# Patient Record
Sex: Male | Born: 1975 | Hispanic: No | Marital: Married | Smoking: Never smoker
Health system: Southern US, Community
[De-identification: ages and names within clinical notes are randomized; demographics above are authoritative.]

## PROBLEM LIST (undated history)

## (undated) DIAGNOSIS — N200 Calculus of kidney: Secondary | ICD-10-CM

---

## 2002-03-30 ENCOUNTER — Emergency Department (HOSPITAL_COMMUNITY): Admission: EM | Admit: 2002-03-30 | Discharge: 2002-03-30 | Payer: Self-pay | Admitting: Emergency Medicine

## 2010-08-27 ENCOUNTER — Emergency Department (HOSPITAL_COMMUNITY)
Admission: EM | Admit: 2010-08-27 | Discharge: 2010-08-27 | Disposition: A | Discharge status: Home / Self Care | Payer: Self-pay | Attending: Emergency Medicine | Admitting: Emergency Medicine

## 2010-08-27 ENCOUNTER — Emergency Department (HOSPITAL_COMMUNITY): Payer: Self-pay

## 2010-08-27 ENCOUNTER — Encounter (HOSPITAL_COMMUNITY): Payer: Self-pay | Admitting: Radiology

## 2010-08-27 DIAGNOSIS — N201 Calculus of ureter: Secondary | ICD-10-CM | POA: Insufficient documentation

## 2010-08-27 DIAGNOSIS — R61 Generalized hyperhidrosis: Secondary | ICD-10-CM | POA: Insufficient documentation

## 2010-08-27 DIAGNOSIS — R109 Unspecified abdominal pain: Secondary | ICD-10-CM | POA: Insufficient documentation

## 2010-08-27 DIAGNOSIS — N390 Urinary tract infection, site not specified: Secondary | ICD-10-CM | POA: Insufficient documentation

## 2010-08-27 DIAGNOSIS — N1330 Unspecified hydronephrosis: Secondary | ICD-10-CM | POA: Insufficient documentation

## 2010-08-27 DIAGNOSIS — N200 Calculus of kidney: Secondary | ICD-10-CM | POA: Insufficient documentation

## 2010-08-27 LAB — URINE MICROSCOPIC-ADD ON

## 2010-08-27 LAB — URINALYSIS, ROUTINE W REFLEX MICROSCOPIC
Glucose, UA: NEGATIVE mg/dL
Ketones, ur: NEGATIVE mg/dL
Protein, ur: NEGATIVE mg/dL
pH: 5.5 (ref 5.0–8.0)

## 2010-08-28 LAB — URINE CULTURE

## 2011-09-10 ENCOUNTER — Encounter (HOSPITAL_COMMUNITY): Payer: Self-pay

## 2011-09-10 ENCOUNTER — Emergency Department (HOSPITAL_COMMUNITY)
Admission: EM | Admit: 2011-09-10 | Discharge: 2011-09-10 | Disposition: A | Discharge status: Home / Self Care | Payer: Self-pay | Attending: Emergency Medicine | Admitting: Emergency Medicine

## 2011-09-10 ENCOUNTER — Emergency Department (HOSPITAL_COMMUNITY): Payer: Self-pay

## 2011-09-10 DIAGNOSIS — R109 Unspecified abdominal pain: Secondary | ICD-10-CM | POA: Insufficient documentation

## 2011-09-10 DIAGNOSIS — Z87442 Personal history of urinary calculi: Secondary | ICD-10-CM | POA: Insufficient documentation

## 2011-09-10 DIAGNOSIS — N200 Calculus of kidney: Secondary | ICD-10-CM | POA: Insufficient documentation

## 2011-09-10 HISTORY — DX: Calculus of kidney: N20.0

## 2011-09-10 LAB — POCT I-STAT, CHEM 8
Calcium, Ion: 1.26 mmol/L (ref 1.12–1.32)
Glucose, Bld: 105 mg/dL — ABNORMAL HIGH (ref 70–99)
HCT: 50 % (ref 39.0–52.0)
Hemoglobin: 17 g/dL (ref 13.0–17.0)
TCO2: 26 mmol/L (ref 0–100)

## 2011-09-10 LAB — URINE MICROSCOPIC-ADD ON

## 2011-09-10 LAB — URINALYSIS, ROUTINE W REFLEX MICROSCOPIC
Glucose, UA: NEGATIVE mg/dL
pH: 5.5 (ref 5.0–8.0)

## 2011-09-10 MED ORDER — IBUPROFEN 800 MG PO TABS: 800.0000 mg | ORAL_TABLET | Freq: Three times a day (TID) | ORAL | Status: AC

## 2011-09-10 MED ORDER — OXYCODONE-ACETAMINOPHEN 5-325 MG PO TABS: ORAL_TABLET | ORAL | Status: AC

## 2011-09-10 MED ORDER — KETOROLAC TROMETHAMINE 30 MG/ML IJ SOLN
30.0000 mg | Freq: Once | INTRAMUSCULAR | Status: AC
  Administered 2011-09-10: 30 mg via INTRAVENOUS
  Filled 2011-09-10: qty 1

## 2011-09-10 NOTE — ED Notes (Signed)
Patient returned from xray.

## 2011-09-10 NOTE — ED Notes (Signed)
Rt. Flank pain began last night, pain when he is voiding. Denies any n/v denies any hematuria

## 2011-09-10 NOTE — Discharge Instructions (Signed)
Take ibuprofen for pain and use percocet for breakthrough pain but do not drive or operate machinery with percocet use. Strain all your urine and keep any stone caught that you should take with you to urology follow up. Follow up with urologist in the next 1-2 weeks for recheck of ongoing pain but return to [facility] ER for changing or worsening of symptoms.   Piedras o clculos renales (Kidney Stones)  Los clculos renales (litiasis ureteral) son depsitos que se forman en el interior de los riones. El dolor intenso es causado por el movimiento de la piedra a travs del tracto urinario. Cuando la piedra se mueve, el ureter hace un espasmo alrededor de la misma. El clculo generalmente se elimina con la [place]. CAUSAS  Un trastorno que hace que ciertas glndulas del cuello produzcan demasiada hormona paratiroidea (hiperparatiroidismo primario).   La acumulacin de cristales de cido rico.   Estrechamiento del urter.   Obstruccin en el rin presente al nacer (obstruccin congnita).   Cirugas previas del rin o los urteres.   Varias infecciones renales.  SNTOMAS  Ganas de vomitar (nuseas ).   Vmitos   Sangre en la orina (hematuria).   Dolor que generalmente se expande (irradia) hacia la ingle.   Ganas de orinar con frecuencia o de manera urgente.  DIAGNSTICO  Historia clnica y examen fsico.   Anlisis de sangre y [place].   Tomografas computadas.   En algunos casos se realiza un examen del interior de la vejiga (citoscopa).  TRATAMIENTO  Observacin.   Aumente su ingesta de lquidos.   Ser necesaria la ciruga si tiene dolor muy intenso o la obstruccin persiste.  El tamao, la ubicacin y la composicin qumica son variables importantes que determinarn la eleccin correcta de tratamiento para su caso. Comunquese con su mdico para comprender mejor su afeccin, de modo que pueda minimizar los riesgos de lesiones en el rin.  INSTRUCCIONES PARA EL  CUIDADO DOMICILIARIO  Beba gran cantidad de lquidos para mantener la orina de tono claro o color amarillo plido.   Filtre toda la orina (le proporcionarn un filtro). Guarde todas las partculas y piedras para que las vea el profesional que lo asiste. La piedra que causa el dolor puede ser tan diminuta como un grano de sal. Es muy importante que utilice el filtro todas las veces que orine. Obtener la piedra permitir al profesional Financial risk analyst y Investment banker, operational que de hecho ha sido expulsada.   Utilice los medicamentos de venta libre o de prescripcin para Chief Technology Officer, Environmental health practitioner o la [place], segn se lo indique el profesional que lo asiste.   Cumpla con las citas de seguimiento tal como le indic el profesional que lo asiste.   Puede necesitar un seguimiento especializado con radiografas. La ausencia de dolor no siempre significa que el clculo se ha eliminado. Puede ser simplemente que haya dejado de moverse. Si el paso de orina permanece completamente obstruido, puede producirse la prdida de la funcin renal o la destruccin del rin. Es su responsabilidad cumplir el seguimiento y las radiografas indicadas. Pueden realizarse ecografas del rin para verificar obsstrucciones y Training and development officer del mismo. Las ecografas no producen radiacin y pueden realizarse fcilmente en cuestin de minutos.  SOLICITE ATENCIN MDICA DE INMEDIATO SI:  No puede controlar el dolor con los medicamentos que le han prescrito.   Tiene fiebre.   La gravedad o intensidad del dolor aumenta luego de 18 horas y no se reduce con los medicamentos para Chief Technology Officer.   Presenta  un nuevo episodio de dolor abdominal.   Se marea o pierde el conocimiento.  EST SEGURO QUE:   Comprende las instrucciones para el alta mdica.   Controlar su enfermedad.   Solicitar atencin mdica de inmediato segn las indicaciones.  Document Released: 04/18/2005 Document Revised: 04/07/2011 [facility] Patient Information 2012 [place], [place].

## 2011-09-10 NOTE — ED Provider Notes (Signed)
History     CSN: [id number]  Arrival date & time 09/10/11  4098   First MD Initiated Contact with Patient 09/10/11 1007      Chief Complaint  Patient presents with  . Flank Pain    (Consider location/radiation/quality/duration/timing/severity/associated sxs/prior treatment) Patient is a 36 y.o. male presenting with flank pain.  Flank Pain   Patient who states he had kidney stone approximately a year ago presents to ER complaining of acute onset right flank pain that began last night and has been intermittent since onset but consistent with hx of kidney stone pain. Patient has taken nothing for pain PTA. Denies fevers, chills, CP, SOB, abdominal pain, n/v/d, hematuria, or dysuria. Patient states he has pain in his right flank with urination. Denies aggravating or alleviating factors. Patient states he has no other known medical problems and takes no meds on regular basis.  Past Medical History  Diagnosis Date  . Kidney calculi     History reviewed. No pertinent past surgical history.  No family history on file.  History  Substance Use Topics  . Smoking status: Never Smoker   . Smokeless tobacco: Not on file  . Alcohol Use: No      Review of Systems  Genitourinary: Positive for flank pain.  All other systems reviewed and are negative.    Allergies  Review of patient's allergies indicates no known allergies.  Home Medications   Current Outpatient Rx  Name Route Sig Dispense Refill  . ACETAMINOPHEN 500 MG PO TABS Oral Take 1,000 mg by mouth every 6 (six) hours as needed. For pain    . IBUPROFEN 800 MG PO TABS Oral Take 1 tablet (800 mg total) by mouth 3 (three) times daily. Take 800mg  by mouth at breakfast, lunch and dinner for the next 5 days 21 tablet 0  . OXYCODONE-ACETAMINOPHEN 5-325 MG PO TABS  Take 1-2 tabs every 4 hours as needed for pain. 15 tablet 0    BP 116/85  Pulse 65  Temp(Src) 98.2 F (36.8 C) (Oral)  Resp 18  SpO2 98%  Physical Exam  Nursing  note and vitals reviewed. Constitutional: He is oriented to person, place, and time. He appears well-developed and well-nourished. No distress.  HENT:  Head: Normocephalic and atraumatic.  Eyes: Conjunctivae are normal.  Neck: Normal range of motion. Neck supple.  Cardiovascular: Normal rate, regular rhythm, normal heart sounds and intact distal pulses.  Exam reveals no gallop and no friction rub.   No murmur heard. Pulmonary/Chest: Effort normal and breath sounds normal. No respiratory distress. He has no wheezes. He has no rales. He exhibits no tenderness.  Abdominal: Soft. Bowel sounds are normal. He exhibits no distension and no mass. There is no tenderness. There is no rebound and no guarding.       Right CVA TTP.   Neurological: He is alert and oriented to person, place, and time.  Skin: Skin is warm and dry. No rash noted. He is not diaphoretic. No erythema.  Psychiatric: He has a normal mood and affect.    ED Course  Procedures (including critical care time)  Patient drove self to ER. IV toradol.   Labs Reviewed  URINALYSIS, ROUTINE W REFLEX MICROSCOPIC - Abnormal; Notable for the following:    APPearance CLOUDY (*)    Hgb urine dipstick LARGE (*)    Bilirubin Urine SMALL (*)    Leukocytes, UA TRACE (*)    All other components within normal limits  POCT I-STAT, CHEM 8 -  Abnormal; Notable for the following:    Glucose, Bld 105 (*)    All other components within normal limits  URINE MICROSCOPIC-ADD ON   Dg Abd 1 View  09/10/2011  *RADIOLOGY REPORT*  Clinical Data:  right flank pain  ABDOMEN - 1 VIEW  Comparison: CT scan 08/27/2010  Findings: There is nonspecific nonobstructive bowel gas pattern. The study is limited by abundant stool in the right colon.  There is poorly visualized calcification in the right abdomen just above L3 transverse process measures about 3 mm.  A proximal right ureteral calculus cannot be excluded.  IMPRESSION:  There is poorly visualized calcification  in the right abdomen just above L3 transverse process measures about 3 mm.  A proximal right ureteral calculus cannot be excluded.  Original Report Authenticated By: Natasha Mead, M.D.     1. Kidney stone       MDM  3mm stone in patient with known hx of stones and same pain with no fever and no UTI on UA to complicate passing of small stone. Normal creatinine. Non acute abdomen. Patient voices understanding of stone pain management and following up with urology as needed but returning to [facility] ER for changing or worsening of symptoms.         [place], [place] 09/10/11 1108

## 2011-09-10 NOTE — ED Notes (Signed)
Patient transported to X-ray 

## 2011-09-10 NOTE — ED Notes (Signed)
Patient states he started having back pain in his lower right side last night and pain when urinating denies bleeding. Patient able to sleep through out the night and pain started again this morning. Patient hx of kidney stone x 1 year ago. Patient denies N/V/D/F.

## 2011-09-11 NOTE — ED Provider Notes (Signed)
Medical screening examination/treatment/procedure(s) were performed by non-physician practitioner and as supervising physician I was immediately available for consultation/collaboration.    Nelia Shi, MD 09/11/11 [phone]

## 2019-03-15 ENCOUNTER — Emergency Department (HOSPITAL_COMMUNITY): Payer: Self-pay

## 2019-03-15 ENCOUNTER — Encounter (HOSPITAL_COMMUNITY): Payer: Self-pay

## 2019-03-15 ENCOUNTER — Other Ambulatory Visit: Payer: Self-pay

## 2019-03-15 ENCOUNTER — Emergency Department (HOSPITAL_COMMUNITY)
Admission: EM | Admit: 2019-03-15 | Discharge: 2019-03-15 | Disposition: A | Discharge status: Home / Self Care | Payer: Self-pay | Attending: Emergency Medicine | Admitting: Emergency Medicine

## 2019-03-15 DIAGNOSIS — R1032 Left lower quadrant pain: Secondary | ICD-10-CM | POA: Insufficient documentation

## 2019-03-15 DIAGNOSIS — N200 Calculus of kidney: Secondary | ICD-10-CM | POA: Insufficient documentation

## 2019-03-15 DIAGNOSIS — N179 Acute kidney failure, unspecified: Secondary | ICD-10-CM | POA: Insufficient documentation

## 2019-03-15 DIAGNOSIS — N132 Hydronephrosis with renal and ureteral calculous obstruction: Secondary | ICD-10-CM | POA: Insufficient documentation

## 2019-03-15 LAB — CBC
HCT: 48.3 % (ref 39.0–52.0)
Hemoglobin: 15.9 g/dL (ref 13.0–17.0)
MCH: 29.6 pg (ref 26.0–34.0)
MCHC: 32.9 g/dL (ref 30.0–36.0)
MCV: 89.8 fL (ref 80.0–100.0)
Platelets: 344 10*3/uL (ref 150–400)
RBC: 5.38 MIL/uL (ref 4.22–5.81)
RDW: 12.6 % (ref 11.5–15.5)
WBC: 14.2 10*3/uL — ABNORMAL HIGH (ref 4.0–10.5)
nRBC: 0 % (ref 0.0–0.2)

## 2019-03-15 LAB — URINALYSIS, ROUTINE W REFLEX MICROSCOPIC
Bacteria, UA: NONE SEEN
Bilirubin Urine: NEGATIVE
Glucose, UA: NEGATIVE mg/dL
Ketones, ur: 80 mg/dL — AB
Leukocytes,Ua: NEGATIVE
Nitrite: NEGATIVE
Protein, ur: NEGATIVE mg/dL
Specific Gravity, Urine: 1.023 (ref 1.005–1.030)
pH: 5 (ref 5.0–8.0)

## 2019-03-15 LAB — COMPREHENSIVE METABOLIC PANEL
ALT: 32 U/L (ref 0–44)
AST: 28 U/L (ref 15–41)
Albumin: 3.8 g/dL (ref 3.5–5.0)
Alkaline Phosphatase: 89 U/L (ref 38–126)
Anion gap: 11 (ref 5–15)
BUN: 17 mg/dL (ref 6–20)
CO2: 22 mmol/L (ref 22–32)
Calcium: 9 mg/dL (ref 8.9–10.3)
Chloride: 104 mmol/L (ref 98–111)
Creatinine, Ser: 1.46 mg/dL — ABNORMAL HIGH (ref 0.61–1.24)
GFR calc Af Amer: >60 mL/min (ref >60)
GFR calc non Af Amer: 58 mL/min — ABNORMAL LOW (ref >60)
Glucose, Bld: 94 mg/dL (ref 70–99)
Potassium: 3.7 mmol/L (ref 3.5–5.1)
Sodium: 137 mmol/L (ref 135–145)
Total Bilirubin: 1.1 mg/dL (ref 0.3–1.2)
Total Protein: 7.6 g/dL (ref 6.5–8.1)

## 2019-03-15 LAB — LIPASE, BLOOD: Lipase: 23 U/L (ref 11–51)

## 2019-03-15 MED ORDER — ONDANSETRON 4 MG PO TBDP
8.0000 mg | ORAL_TABLET | Freq: Once | ORAL | Status: AC
  Administered 2019-03-15: 03:00:00 8 mg via ORAL
  Filled 2019-03-15: qty 2

## 2019-03-15 MED ORDER — ONDANSETRON HCL 4 MG PO TABS: 4.0000 mg | ORAL_TABLET | Freq: Three times a day (TID) | ORAL | 0 refills | Status: AC | PRN

## 2019-03-15 MED ORDER — OXYCODONE-ACETAMINOPHEN 5-325 MG PO TABS
1.0000 | ORAL_TABLET | Freq: Once | ORAL | Status: AC
  Administered 2019-03-15: 10:00:00 1 via ORAL
  Filled 2019-03-15: qty 1

## 2019-03-15 MED ORDER — HYDROMORPHONE HCL 1 MG/ML IJ SOLN
1.0000 mg | Freq: Once | INTRAMUSCULAR | Status: AC
  Administered 2019-03-15: 03:00:00 1 mg via INTRAMUSCULAR
  Filled 2019-03-15: qty 1

## 2019-03-15 MED ORDER — OXYCODONE-ACETAMINOPHEN 5-325 MG PO TABS
1.0000 | ORAL_TABLET | Freq: Once | ORAL | Status: AC
  Administered 2019-03-15: 1 via ORAL
  Filled 2019-03-15: qty 1

## 2019-03-15 MED ORDER — OXYCODONE-ACETAMINOPHEN 5-325 MG PO TABS: 1.0000 | ORAL_TABLET | Freq: Four times a day (QID) | ORAL | 0 refills | Status: DC | PRN

## 2019-03-15 MED ORDER — TAMSULOSIN HCL 0.4 MG PO CAPS: 0.4000 mg | ORAL_CAPSULE | Freq: Every day | ORAL | 0 refills | Status: AC

## 2019-03-15 MED ORDER — HYDROMORPHONE HCL 1 MG/ML IJ SOLN
1.0000 mg | Freq: Once | INTRAMUSCULAR | Status: DC
  Filled 2019-03-15: qty 1

## 2019-03-15 NOTE — Discharge Instructions (Signed)
Strain urine every time you pee. Take ibuprofen and Tylenol every 6 hours as needed for pain. For severe pain take norco or vicodin however realize they have the potential for addiction and it can make you sleepy and has tylenol in it.  No operating machinery while taking. Return to the emergency room if you develop persistent vomiting, fevers or uncontrolled pain.

## 2019-03-15 NOTE — ED Triage Notes (Signed)
Patient discharged from ED with flank pain/ kidney stone. Did not call urology and back for pain

## 2019-03-15 NOTE — ED Triage Notes (Signed)
Patient here with left flank pain.  Patient with history of kidney stones.  Patient denies any nausea or vomiting.

## 2019-03-15 NOTE — ED Notes (Signed)
Discharge instructions discussed with pt. Pt verbalized understanding. Pt stable and ambulatory. No signature pad available. 

## 2019-03-15 NOTE — ED Provider Notes (Addendum)
[name] Provider Note   CSN: [id number] Arrival date & time: 03/15/19  1646     History   Chief Complaint No chief complaint on file.   HPI Bryan Brandy HaleCarlos Shaffer is a 43 y.o. male.     HPI  Patient seen earlier today for a CT proven kidney stone on the left side that is 5 mm.  He was discharged without pain or nausea medicine, so he returns back for intense pain.  Pain is currently severe.  He denies any fevers in the meantime, chills, emesis, current nausea.  He has a history of nephrolithiasis, the last kidney stone was 5 to 6 years ago.  Tylenol and Motrin did not have any relief for his symptoms.  Movement makes his pain worse.  Nothing seems to make it better.  Past Medical History:  Diagnosis Date   Kidney calculi     There are no active problems to display for this patient.   History reviewed. No pertinent surgical history.      Home Medications    Prior to Admission medications   Medication Sig Start Date End Date Taking? Authorizing Provider  acetaminophen (TYLENOL) 500 MG tablet Take 1,000 mg by mouth every 6 (six) hours as needed. For pain    [provider]  ondansetron (ZOFRAN) 4 MG tablet Take 1 tablet (4 mg total) by mouth every 8 (eight) hours as needed for up to 15 doses for nausea or vomiting. 03/15/19   Chester HolsteinVaithi, Tony Granquist, MD  oxyCODONE-acetaminophen (PERCOCET/ROXICET) 5-325 MG tablet Take 1 tablet by mouth every 6 (six) hours as needed for severe pain. 03/15/19   Long, Arlyss RepressJoshua G, MD  tamsulosin (FLOMAX) 0.4 MG CAPS capsule Take 1 capsule (0.4 mg total) by mouth daily for 14 days. 03/15/19 03/29/19  Long, Arlyss RepressJoshua G, MD    Family History No family history on file.  Social History Social History   Tobacco Use   Smoking status: Never Smoker  Substance Use Topics   Alcohol use: No   Drug use: No     Allergies   Patient has no known allergies.   Review of Systems Review of Systems    Constitutional: Negative for chills, diaphoresis and fever.  Gastrointestinal: Negative for abdominal pain, nausea and vomiting.  Genitourinary: Positive for flank pain. Negative for dysuria.  All other systems reviewed and are negative.    Physical Exam Updated Vital Signs BP (!) 139/92 (BP Location: Right Arm)    Pulse 78    Temp 98.4 F (36.9 C) (Oral)    Resp 18    SpO2 99%   Physical Exam Vitals signs and nursing note reviewed.  Constitutional:      General: He is in acute distress.     Appearance: He is well-developed. He is obese.     Comments: Patient clutching his left side and in extreme pain  HENT:     Head: Normocephalic and atraumatic.  Eyes:     Conjunctiva/sclera: Conjunctivae normal.  Neck:     Musculoskeletal: Neck supple.  Cardiovascular:     Rate and Rhythm: Normal rate and regular rhythm.     Heart sounds: No murmur.  Pulmonary:     Effort: Pulmonary effort is normal. No respiratory distress.     Breath sounds: Normal breath sounds.  Abdominal:     General: There is no distension.     Palpations: Abdomen is soft.     Tenderness: There is no abdominal tenderness. There is  left CVA tenderness. There is no right CVA tenderness, guarding or rebound.     Comments: Intense left CVA tenderness  Skin:    General: Skin is warm and dry.  Neurological:     Mental Status: He is alert.      ED Treatments / Results  Labs (all labs ordered are listed, but only abnormal results are displayed) Labs Reviewed - No data to display  EKG None  Radiology Ct Renal Stone Study  Result Date: 03/15/2019 CLINICAL DATA:  43 year old male with history of left-sided flank pain. Recurrent stone disease is suspected. EXAM: CT ABDOMEN AND PELVIS WITHOUT CONTRAST TECHNIQUE: Multidetector CT imaging of the abdomen and pelvis was performed following the standard protocol without IV contrast. COMPARISON:  CT the abdomen and pelvis 08/27/2010. FINDINGS: Lower chest: Linear  scarring in the right lower lobe. Hepatobiliary: Mild diffuse low attenuation throughout the hepatic parenchyma, indicative of hepatic steatosis. No definite cystic or solid hepatic lesions are confidently identified on today's noncontrast CT examination. Unenhanced appearance of the gallbladder is normal. Pancreas: No definite pancreatic mass or peripancreatic fluid collections or inflammatory changes are noted on today's noncontrast CT examination. Spleen: Unremarkable. Adrenals/Urinary Tract: Multiple tiny 2-3 mm pulmonary nodules noted in the collecting systems of the kidneys bilaterally. In addition, in the proximal third of the left ureter (coronal image 51 of series 5) there is a 5 mm calculus. This is associated with very mild proximal left hydroureteronephrosis and perinephric stranding. No additional calculi are noted along the course of either ureter or within the lumen of the urinary bladder. No right hydroureteronephrosis. Unenhanced appearance of the kidneys, bilateral adrenal glands and urinary bladder is otherwise unremarkable. Stomach/Bowel: Normal appearance of the stomach. No pathologic dilatation of small bowel or colon. Normal appendix. Vascular/Lymphatic: No atherosclerotic calcifications in the abdominal aorta or pelvic vasculature. No lymphadenopathy noted in the abdomen or pelvis. Reproductive: Prostate gland and seminal vesicles are unremarkable in appearance. Other: No significant volume of ascites.  No pneumoperitoneum. Musculoskeletal: There are no aggressive appearing lytic or blastic lesions noted in the visualized portions of the skeleton. IMPRESSION: 1. Numerous nonobstructive calculi measuring 2-3 mm in the collecting systems of both kidneys. In addition, there is a 5 mm calculus in the proximal third of the left ureter with mild proximal left hydroureteronephrosis and perinephric stranding, indicative of mild obstruction at this time. 2. Hepatic steatosis. Electronically Signed    By: Vinnie Langton M.D.   On: 03/15/2019 10:06    Procedures Procedures (including critical care time)  Medications Ordered in ED Medications  oxyCODONE-acetaminophen (PERCOCET/ROXICET) 5-325 MG per tablet 1 tablet (has no administration in time range)     Initial Impression / Assessment and Plan / ED Course  I have reviewed the triage vital signs and the nursing notes.  Pertinent labs & imaging results that were available during my care of the patient were reviewed by me and considered in my medical decision making (see chart for details).        Fenix Unknown Jim is a 43 y.o. male with a past medical history of nephrolithiasis presents today for recurrent nephrolithiasis and a CT proven 5 mm left-sided renal calculi.  He was seen in our emergency department earlier today, but he did not receive a prescription for pain medicine, nausea medicine.  He is given IV pain medicine in the emergency department as he states he has a ride to come pick him up.  He is given prescriptions for pain medicine, nausea medicine,  Flomax all questions answered.  Care of patient discussed with the supervising attending.  Final Clinical Impressions(s) / ED Diagnoses   Final diagnoses:  Kidney stone    ED Discharge Orders         Ordered    ondansetron (ZOFRAN) 4 MG tablet  Every 8 hours PRN     03/15/19 1923    oxyCODONE-acetaminophen (PERCOCET/ROXICET) 5-325 MG tablet  Every 6 hours PRN     03/15/19 1925    tamsulosin (FLOMAX) 0.4 MG CAPS capsule  Daily     03/15/19 1925           Chester Holstein, MD 03/15/19 1950    LongArlyss Repress, MD 03/16/19 1215

## 2019-03-15 NOTE — ED Provider Notes (Signed)
Patient placed in Quick Look pathway, seen and evaluated   Chief Complaint: Left Flank Pain  HPI:   3 days of intermittent left flank pain. Severe sharp throb no clear aggravating or alleviating factors, no radiation. Took tylenol yesterday morning without relief, no other meds. Reports history of left kidney stone 5 years ago that felt the same.  ROS: + Flank pain            - Fever/Chills, Nausea, Vomiting, Dysuria, Hematuria, Testicular pain/swelling, Fall/injury, or any additional concerns.  Physical Exam:   Gen: No distress  Neuro: Awake and Alert  Skin: Warm    Focused Exam: No midline C/T/L spinal tenderness to palpation, no paraspinal muscle tenderness, no deformity, crepitus, or step-off noted. No sign of injury to the neck or back. Left CVA sign. GU exam deferred by patient. Abdomen is soft, nontender and without distension or peritoneal signs.  CBC with wbc 14.2 otherwise wnl CMP with cr 1.46 slightly elevated from previous 7 years ago UA with moderate Hgb, 6-10 rbc, ketones, negative bacteria or wbc. - Suspect kidney stone at this time, leukocytosis possibly secondary to pain, no infectious symptoms. Will add lipase and CT renal stone study, pain med given.   Initiation of care has begun. The patient has been counseled on the process, plan, and necessity for staying for the completion/evaluation, and the remainder of the medical screening examination    Gari Crown 03/15/19 0814    Valarie Merino, MD 03/15/19 [phone]

## 2019-03-15 NOTE — ED Provider Notes (Signed)
[facility] EMERGENCY DEPARTMENT Provider Note   CSN: [id number] Arrival date & time: 03/15/19  0156     History   Chief Complaint Chief Complaint  Patient presents with  . Flank Pain    HPI Bryan Shaffer is a 43 y.o. male.     Patient with history of kidney stone 5 or 6 years ago presents with intermittent left flank pain for 2 to 3 days.  Pain currently minimal.  No fevers chills or vomiting.  No diarrhea.     Past Medical History:  Diagnosis Date  . Kidney calculi     There are no active problems to display for this patient.   No past surgical history on file.      Home Medications    Prior to Admission medications   Medication Sig Start Date End Date Taking? Authorizing Provider  acetaminophen (TYLENOL) 500 MG tablet Take 1,000 mg by mouth every 6 (six) hours as needed. For pain    [provider]    Family History No family history on file.  Social History Social History   Tobacco Use  . Smoking status: Never Smoker  Substance Use Topics  . Alcohol use: No  . Drug use: No     Allergies   Patient has no known allergies.   Review of Systems Review of Systems  Constitutional: Negative for chills and fever.  HENT: Negative for congestion.   Eyes: Negative for visual disturbance.  Respiratory: Negative for shortness of breath.   Cardiovascular: Negative for chest pain.  Gastrointestinal: Positive for nausea. Negative for abdominal pain and vomiting.  Genitourinary: Positive for flank pain. Negative for dysuria.  Musculoskeletal: Negative for back pain, neck pain and neck stiffness.  Skin: Negative for rash.  Neurological: Negative for light-headedness and headaches.     Physical Exam Updated Vital Signs BP 128/86 (BP Location: Left Arm)   Pulse 85   Temp 99.1 F (37.3 C) (Oral)   Resp 18   SpO2 97%   Physical Exam Vitals signs and nursing note reviewed.  Constitutional:      Appearance: He  is well-developed.  HENT:     Head: Normocephalic and atraumatic.  Eyes:     General:        Right eye: No discharge.        Left eye: No discharge.     Conjunctiva/sclera: Conjunctivae normal.  Neck:     Musculoskeletal: Normal range of motion and neck supple.     Trachea: No tracheal deviation.  Cardiovascular:     Rate and Rhythm: Normal rate and regular rhythm.  Pulmonary:     Effort: Pulmonary effort is normal.     Breath sounds: Normal breath sounds.  Abdominal:     General: There is no distension.     Palpations: Abdomen is soft.     Tenderness: There is no abdominal tenderness. There is no guarding.  Skin:    General: Skin is warm.     Findings: No rash.  Neurological:     Mental Status: He is alert and oriented to person, place, and time.      ED Treatments / Results  Labs (all labs ordered are listed, but only abnormal results are displayed) Labs Reviewed  COMPREHENSIVE METABOLIC PANEL - Abnormal; Notable for the following components:      Result Value   Creatinine, Ser 1.46 (*)    GFR calc non Af Amer 58 (*)    All other components  within normal limits  CBC - Abnormal; Notable for the following components:   WBC 14.2 (*)    All other components within normal limits  URINALYSIS, ROUTINE W REFLEX MICROSCOPIC - Abnormal; Notable for the following components:   Hgb urine dipstick MODERATE (*)    Ketones, ur 80 (*)    All other components within normal limits  URINE CULTURE  LIPASE, BLOOD    EKG None  Radiology Ct Renal Stone Study  Result Date: 03/15/2019 CLINICAL DATA:  43 year old male with history of left-sided flank pain. Recurrent stone disease is suspected. EXAM: CT ABDOMEN AND PELVIS WITHOUT CONTRAST TECHNIQUE: Multidetector CT imaging of the abdomen and pelvis was performed following the standard protocol without IV contrast. COMPARISON:  CT the abdomen and pelvis 08/27/2010. FINDINGS: Lower chest: Linear scarring in the right lower lobe.  Hepatobiliary: Mild diffuse low attenuation throughout the hepatic parenchyma, indicative of hepatic steatosis. No definite cystic or solid hepatic lesions are confidently identified on today's noncontrast CT examination. Unenhanced appearance of the gallbladder is normal. Pancreas: No definite pancreatic mass or peripancreatic fluid collections or inflammatory changes are noted on today's noncontrast CT examination. Spleen: Unremarkable. Adrenals/Urinary Tract: Multiple tiny 2-3 mm pulmonary nodules noted in the collecting systems of the kidneys bilaterally. In addition, in the proximal third of the left ureter (coronal image 51 of series 5) there is a 5 mm calculus. This is associated with very mild proximal left hydroureteronephrosis and perinephric stranding. No additional calculi are noted along the course of either ureter or within the lumen of the urinary bladder. No right hydroureteronephrosis. Unenhanced appearance of the kidneys, bilateral adrenal glands and urinary bladder is otherwise unremarkable. Stomach/Bowel: Normal appearance of the stomach. No pathologic dilatation of small bowel or colon. Normal appendix. Vascular/Lymphatic: No atherosclerotic calcifications in the abdominal aorta or pelvic vasculature. No lymphadenopathy noted in the abdomen or pelvis. Reproductive: Prostate gland and seminal vesicles are unremarkable in appearance. Other: No significant volume of ascites.  No pneumoperitoneum. Musculoskeletal: There are no aggressive appearing lytic or blastic lesions noted in the visualized portions of the skeleton. IMPRESSION: 1. Numerous nonobstructive calculi measuring 2-3 mm in the collecting systems of both kidneys. In addition, there is a 5 mm calculus in the proximal third of the left ureter with mild proximal left hydroureteronephrosis and perinephric stranding, indicative of mild obstruction at this time. 2. Hepatic steatosis. Electronically Signed   By: Vinnie Langton M.D.   On:  03/15/2019 10:06    Procedures Procedures (including critical care time)  Medications Ordered in ED Medications  HYDROmorphone (DILAUDID) injection 1 mg (1 mg Intramuscular Given 03/15/19 0306)  ondansetron (ZOFRAN-ODT) disintegrating tablet 8 mg (8 mg Oral Given 03/15/19 0306)  oxyCODONE-acetaminophen (PERCOCET/ROXICET) 5-325 MG per tablet 1 tablet (1 tablet Oral Given 03/15/19 0934)     Initial Impression / Assessment and Plan / ED Course  I have reviewed the triage vital signs and the nursing notes.  Pertinent labs & imaging results that were available during my care of the patient were reviewed by me and considered in my medical decision making (see chart for details).       Patient presents with intermittent left flank pain clinical concern for kidney stone.  Blood work reviewed mild renal dysfunction, mild leukocytosis 14.2.  Patient's pain controlled in the ER.  Discussed strict reasons to return and follow-up with urology.  CT scan results reviewed kidney stone 5 m  Results and differential diagnosis were discussed with the patient/parent/guardian. Xrays were independently reviewed  by myself.  Close follow up outpatient was discussed, comfortable with the plan.   Medications  HYDROmorphone (DILAUDID) injection 1 mg (1 mg Intramuscular Given 03/15/19 0306)  ondansetron (ZOFRAN-ODT) disintegrating tablet 8 mg (8 mg Oral Given 03/15/19 0306)  oxyCODONE-acetaminophen (PERCOCET/ROXICET) 5-325 MG per tablet 1 tablet (1 tablet Oral Given 03/15/19 0934)    Vitals:   03/15/19 0211 03/15/19 0548 03/15/19 0839  BP: (!) 136/95 121/83 128/86  Pulse: 94 (!) 52 85  Resp: 18 20 18   Temp: 99.1 F (37.3 C)    TempSrc: Oral    SpO2: 100% 96% 97%    Final diagnoses:  Kidney stone on left side  Acute renal failure, unspecified acute renal failure type [facility](HCC)    Final Clinical Impressions(s) / ED Diagnoses   Final diagnoses:  Kidney stone on left side  Acute renal failure,  unspecified acute renal failure type [facility](HCC)    ED Discharge Orders    None       Blane OharaZavitz, Deneane Stifter, MD 03/15/19 1119

## 2019-03-16 ENCOUNTER — Emergency Department (HOSPITAL_COMMUNITY)
Admission: EM | Admit: 2019-03-16 | Discharge: 2019-03-16 | Disposition: A | Discharge status: Home / Self Care | Payer: Self-pay | Attending: Emergency Medicine | Admitting: Emergency Medicine

## 2019-03-16 ENCOUNTER — Encounter (HOSPITAL_COMMUNITY): Payer: Self-pay | Admitting: Emergency Medicine

## 2019-03-16 DIAGNOSIS — N23 Unspecified renal colic: Secondary | ICD-10-CM | POA: Insufficient documentation

## 2019-03-16 LAB — CBC
HCT: 49.5 % (ref 39.0–52.0)
Hemoglobin: 16.2 g/dL (ref 13.0–17.0)
MCH: 29.9 pg (ref 26.0–34.0)
MCHC: 32.7 g/dL (ref 30.0–36.0)
MCV: 91.5 fL (ref 80.0–100.0)
Platelets: 321 10*3/uL (ref 150–400)
RBC: 5.41 MIL/uL (ref 4.22–5.81)
RDW: 12.9 % (ref 11.5–15.5)
WBC: 15.1 10*3/uL — ABNORMAL HIGH (ref 4.0–10.5)
nRBC: 0 % (ref 0.0–0.2)

## 2019-03-16 LAB — BASIC METABOLIC PANEL
Anion gap: 11 (ref 5–15)
BUN: 24 mg/dL — ABNORMAL HIGH (ref 6–20)
CO2: 23 mmol/L (ref 22–32)
Calcium: 9 mg/dL (ref 8.9–10.3)
Chloride: 103 mmol/L (ref 98–111)
Creatinine, Ser: 1.49 mg/dL — ABNORMAL HIGH (ref 0.61–1.24)
GFR calc Af Amer: >60 mL/min (ref >60)
GFR calc non Af Amer: 57 mL/min — ABNORMAL LOW (ref >60)
Glucose, Bld: 116 mg/dL — ABNORMAL HIGH (ref 70–99)
Potassium: 3.7 mmol/L (ref 3.5–5.1)
Sodium: 137 mmol/L (ref 135–145)

## 2019-03-16 LAB — URINALYSIS, ROUTINE W REFLEX MICROSCOPIC
Bacteria, UA: NONE SEEN
Bilirubin Urine: NEGATIVE
Glucose, UA: NEGATIVE mg/dL
Ketones, ur: 5 mg/dL — AB
Nitrite: NEGATIVE
Protein, ur: 30 mg/dL — AB
RBC / HPF: >50 RBC/hpf — ABNORMAL HIGH (ref 0–5)
Specific Gravity, Urine: 1.033 — ABNORMAL HIGH (ref 1.005–1.030)
pH: 5 (ref 5.0–8.0)

## 2019-03-16 LAB — URINE CULTURE: Culture: <10000 — AB

## 2019-03-16 MED ORDER — IBUPROFEN 600 MG PO TABS: 600.0000 mg | ORAL_TABLET | Freq: Four times a day (QID) | ORAL | 0 refills | Status: AC | PRN

## 2019-03-16 MED ORDER — KETOROLAC TROMETHAMINE 60 MG/2ML IM SOLN
30.0000 mg | Freq: Once | INTRAMUSCULAR | Status: AC
  Administered 2019-03-16: 30 mg via INTRAMUSCULAR
  Filled 2019-03-16: qty 2

## 2019-03-16 NOTE — ED Provider Notes (Signed)
[facility] DEPT Provider Note  CSN: [id number] Arrival date & time: 03/16/19 0047  Chief Complaint(s) Flank Pain  HPI Bryan Shaffer is a 43 y.o. male with known left 5 mm ureteral stone presents to the emergency department with left flank pain.  Patient reports that he was seen at [facility] and prescribed Percocets but has not been able to fill the prescription.  Reports that he has been taking Motrin but has not taken any in 3 days.  No alleviating or aggravating factors.  No acute changes in pain.  No nausea or vomiting.  No fevers or chills.  No urinary symptoms.  No other physical complaints.  HPI  Past Medical History Past Medical History:  Diagnosis Date  . Kidney calculi    There are no active problems to display for this patient.  Home Medication(s) Prior to Admission medications   Medication Sig Start Date End Date Taking? Authorizing Provider  acetaminophen (TYLENOL) 500 MG tablet Take 1,000 mg by mouth every 6 (six) hours as needed for moderate pain. For pain    Yes [provider]  ibuprofen (ADVIL) 600 MG tablet Take 1 tablet (600 mg total) by mouth every 6 (six) hours as needed. 03/16/19   Cardama, Grayce Sessions, MD  ondansetron (ZOFRAN) 4 MG tablet Take 1 tablet (4 mg total) by mouth every 8 (eight) hours as needed for up to 15 doses for nausea or vomiting. Patient not taking: Reported on 03/16/2019 03/15/19   Julianne Rice, MD  oxyCODONE-acetaminophen (PERCOCET/ROXICET) 5-325 MG tablet Take 1 tablet by mouth every 6 (six) hours as needed for severe pain. Patient not taking: Reported on 03/16/2019 03/15/19   Long, Wonda Olds, MD  tamsulosin (FLOMAX) 0.4 MG CAPS capsule Take 1 capsule (0.4 mg total) by mouth daily for 14 days. Patient not taking: Reported on 03/16/2019 03/15/19 03/29/19  Long, Wonda Olds, MD                  Past Surgical History History reviewed. No pertinent surgical history. Family History History reviewed. No pertinent family history.  Social History Social History   Tobacco Use  . Smoking status: Never Smoker  . Smokeless tobacco: Never Used  Substance Use Topics  . Alcohol use: No  . Drug use: No   Allergies Patient has no known allergies.  Review of Systems Review of Systems All other systems are reviewed and are negative for acute change except as noted in the HPI  Physical Exam Vital Signs  I have reviewed the triage vital signs BP 114/82 (BP Location: Left Arm)   Pulse 64   Temp 98.6 F (37 C) (Oral)   Resp 15   SpO2 96%   Physical Exam Vitals signs reviewed.  Constitutional:      General: He is not in acute distress.    Appearance: He is well-developed. He is not diaphoretic.     Comments: uncomfortable  HENT:     Head: Normocephalic and atraumatic.     Jaw: No trismus.     Right Ear: External ear normal.     Left Ear: External ear normal.     Nose: Nose normal.  Eyes:     General: No scleral icterus.    Conjunctiva/sclera: Conjunctivae normal.  Neck:     Musculoskeletal: Normal range of motion.     Trachea: Phonation normal.  Cardiovascular:     Rate and Rhythm: Normal rate and regular rhythm.  Pulmonary:     Effort:  Pulmonary effort is normal. No respiratory distress.     Breath sounds: No stridor.  Abdominal:     General: There is no distension.     Tenderness: There is left CVA tenderness. There is no guarding or rebound.  Musculoskeletal: Normal range of motion.  Neurological:     Mental Status: He is alert and oriented to person, place, and time.  Psychiatric:        Behavior: Behavior normal.     ED Results and Treatments Labs (all labs ordered are listed, but only abnormal results are displayed) Labs Reviewed  URINALYSIS, ROUTINE W REFLEX MICROSCOPIC - Abnormal; Notable for the following components:      Result  Value   APPearance HAZY (*)    Specific Gravity, Urine 1.033 (*)    Hgb urine dipstick LARGE (*)    Ketones, ur 5 (*)    Protein, ur 30 (*)    Leukocytes,Ua SMALL (*)    RBC / HPF >50 (*)    All other components within normal limits  CBC - Abnormal; Notable for the following components:   WBC 15.1 (*)    All other components within normal limits  BASIC METABOLIC PANEL - Abnormal; Notable for the following components:   Glucose, Bld 116 (*)    BUN 24 (*)    Creatinine, Ser 1.49 (*)    GFR calc non Af Amer 57 (*)    All other components within normal limits                                                                                                                         EKG  EKG Interpretation  Date/Time:    Ventricular Rate:    PR Interval:    QRS Duration:   QT Interval:    QTC Calculation:   R Axis:     Text Interpretation:        Radiology Ct Renal Stone Study  Result Date: 03/15/2019 CLINICAL DATA:  43 year old male with history of left-sided flank pain. Recurrent stone disease is suspected. EXAM: CT ABDOMEN AND PELVIS WITHOUT CONTRAST TECHNIQUE: Multidetector CT imaging of the abdomen and pelvis was performed following the standard protocol without IV contrast. COMPARISON:  CT the abdomen and pelvis 08/27/2010. FINDINGS: Lower chest: Linear scarring in the right lower lobe. Hepatobiliary: Mild diffuse low attenuation throughout the hepatic parenchyma, indicative of hepatic steatosis. No definite cystic or solid hepatic lesions are confidently identified on today's noncontrast CT examination. Unenhanced appearance of the gallbladder is normal. Pancreas: No definite pancreatic mass or peripancreatic fluid collections or inflammatory changes are noted on today's noncontrast CT examination. Spleen: Unremarkable. Adrenals/Urinary Tract: Multiple tiny 2-3 mm pulmonary nodules noted in the collecting systems of the kidneys bilaterally. In addition, in the proximal third of the  left ureter (coronal image 51 of series 5) there is a 5 mm calculus. This is associated with very mild proximal left hydroureteronephrosis and perinephric stranding. No additional calculi are noted along  the course of either ureter or within the lumen of the urinary bladder. No right hydroureteronephrosis. Unenhanced appearance of the kidneys, bilateral adrenal glands and urinary bladder is otherwise unremarkable. Stomach/Bowel: Normal appearance of the stomach. No pathologic dilatation of small bowel or colon. Normal appendix. Vascular/Lymphatic: No atherosclerotic calcifications in the abdominal aorta or pelvic vasculature. No lymphadenopathy noted in the abdomen or pelvis. Reproductive: Prostate gland and seminal vesicles are unremarkable in appearance. Other: No significant volume of ascites.  No pneumoperitoneum. Musculoskeletal: There are no aggressive appearing lytic or blastic lesions noted in the visualized portions of the skeleton. IMPRESSION: 1. Numerous nonobstructive calculi measuring 2-3 mm in the collecting systems of both kidneys. In addition, there is a 5 mm calculus in the proximal third of the left ureter with mild proximal left hydroureteronephrosis and perinephric stranding, indicative of mild obstruction at this time. 2. Hepatic steatosis. Electronically Signed   By: Trudie Reed M.D.   On: 03/15/2019 10:06    Pertinent labs & imaging results that were available during my care of the patient were reviewed by me and considered in my medical decision making (see chart for details).  Medications Ordered in ED Medications  ketorolac (TORADOL) injection 30 mg (30 mg Intramuscular Given 03/16/19 0214)                                                                                                                                    Procedures Procedures  (including critical care time)  Medical Decision Making / ED Course I have reviewed the nursing notes for this encounter and the  patient's prior records (if available in EHR or on provided paperwork).   Krue Mikle Bosworth REA[facility] was evaluated in Emergency Department on 03/16/2019 for the symptoms described in the history of present illness. He was evaluated in the context of the global COVID-19 pandemic, which necessitated consideration that the patient might be at risk for infection with the SARS-CoV-2 virus that causes COVID-19. Institutional protocols and algorithms that pertain to the evaluation of patients at risk for COVID-19 are in a state of rapid change based on information released by regulatory bodies including the CDC and federal and state organizations. These policies and algorithms were followed during the patient's care in the ED.  Renal colic from known stone.  Complete resolution of pain with IM Toradol.  Recommended the use of Motrin as an adjunct for pain control.  The patient appears reasonably screened and/or stabilized for discharge and I doubt any other medical condition or other [facility] requiring further screening, evaluation, or treatment in the ED at this time prior to discharge.  The patient is safe for discharge with strict return precautions.       Final Clinical Impression(s) / ED Diagnoses Final diagnoses:  Ureteral colic    The patient appears reasonably screened and/or stabilized for discharge and I doubt any other medical condition or other [facility] requiring further screening, evaluation,  or treatment in the ED at this time prior to discharge.  Disposition: Discharge  Condition: Good  I have discussed the results, Dx and Tx plan with the patient who expressed understanding and agree(s) with the plan. Discharge instructions discussed at great length. The patient was given strict return precautions who verbalized understanding of the instructions. No further questions at time of discharge.    ED Discharge Orders         Ordered    ibuprofen (ADVIL) 600 MG tablet  Every 6 hours PRN      03/16/19 0403            Follow Up: [facility] [address] [place] [id number] [phone]        This chart was dictated using voice recognition software.  Despite best efforts to proofread,  errors can occur which can change the documentation meaning.   Nira Connardama, Pedro Eduardo, MD 03/16/19 [phone]

## 2019-03-16 NOTE — ED Triage Notes (Signed)
Patient here from home with complaints of left sided flank pain. Seen for same at [facility].

## 2019-03-16 NOTE — ED Notes (Signed)
Pt attempting to provide urine specimen

## 2019-03-16 NOTE — ED Notes (Signed)
Urine culture sent to the lab. 

## 2019-05-28 ENCOUNTER — Other Ambulatory Visit: Payer: Self-pay

## 2019-05-28 ENCOUNTER — Emergency Department (HOSPITAL_COMMUNITY)
Admission: EM | Admit: 2019-05-28 | Discharge: 2019-05-28 | Disposition: A | Discharge status: Home / Self Care | Payer: Self-pay | Attending: Emergency Medicine | Admitting: Emergency Medicine

## 2019-05-28 ENCOUNTER — Emergency Department (HOSPITAL_COMMUNITY): Payer: Self-pay

## 2019-05-28 ENCOUNTER — Encounter (HOSPITAL_COMMUNITY): Payer: Self-pay | Admitting: *Deleted

## 2019-05-28 DIAGNOSIS — U071 COVID-19: Secondary | ICD-10-CM | POA: Insufficient documentation

## 2019-05-28 DIAGNOSIS — N200 Calculus of kidney: Secondary | ICD-10-CM | POA: Insufficient documentation

## 2019-05-28 DIAGNOSIS — Z20822 Contact with and (suspected) exposure to covid-19: Secondary | ICD-10-CM

## 2019-05-28 LAB — URINALYSIS, ROUTINE W REFLEX MICROSCOPIC
Bilirubin Urine: NEGATIVE
Glucose, UA: NEGATIVE mg/dL
Ketones, ur: NEGATIVE mg/dL
Leukocytes,Ua: NEGATIVE
Nitrite: NEGATIVE
Protein, ur: 30 mg/dL — AB
RBC / HPF: >50 RBC/hpf — ABNORMAL HIGH (ref 0–5)
Specific Gravity, Urine: 1.03 (ref 1.005–1.030)
pH: 5 (ref 5.0–8.0)

## 2019-05-28 LAB — BASIC METABOLIC PANEL
Anion gap: 11 (ref 5–15)
BUN: 19 mg/dL (ref 6–20)
CO2: 26 mmol/L (ref 22–32)
Calcium: 8.9 mg/dL (ref 8.9–10.3)
Chloride: 100 mmol/L (ref 98–111)
Creatinine, Ser: 1.12 mg/dL (ref 0.61–1.24)
GFR calc Af Amer: >60 mL/min (ref >60)
GFR calc non Af Amer: >60 mL/min (ref >60)
Glucose, Bld: 114 mg/dL — ABNORMAL HIGH (ref 70–99)
Potassium: 3.6 mmol/L (ref 3.5–5.1)
Sodium: 137 mmol/L (ref 135–145)

## 2019-05-28 LAB — CBC
HCT: 51.6 % (ref 39.0–52.0)
Hemoglobin: 17.6 g/dL — ABNORMAL HIGH (ref 13.0–17.0)
MCH: 30.2 pg (ref 26.0–34.0)
MCHC: 34.1 g/dL (ref 30.0–36.0)
MCV: 88.5 fL (ref 80.0–100.0)
Platelets: 261 10*3/uL (ref 150–400)
RBC: 5.83 MIL/uL — ABNORMAL HIGH (ref 4.22–5.81)
RDW: 12.9 % (ref 11.5–15.5)
WBC: 8 10*3/uL (ref 4.0–10.5)
nRBC: 0 % (ref 0.0–0.2)

## 2019-05-28 LAB — LIPASE, BLOOD: Lipase: 23 U/L (ref 11–51)

## 2019-05-28 LAB — TROPONIN I (HIGH SENSITIVITY): Troponin I (High Sensitivity): 2 ng/L (ref <18)

## 2019-05-28 LAB — SARS CORONAVIRUS 2 (TAT 6-24 HRS): SARS Coronavirus 2: POSITIVE — AB

## 2019-05-28 MED ORDER — SODIUM CHLORIDE 0.9 % IV BOLUS
1000.0000 mL | Freq: Once | INTRAVENOUS | Status: AC
  Administered 2019-05-28: 1000 mL via INTRAVENOUS

## 2019-05-28 MED ORDER — SODIUM CHLORIDE 0.9% FLUSH
3.0000 mL | Freq: Once | INTRAVENOUS | Status: AC
  Administered 2019-05-28: 3 mL via INTRAVENOUS

## 2019-05-28 MED ORDER — OXYCODONE-ACETAMINOPHEN 5-325 MG PO TABS: 1.0000 | ORAL_TABLET | ORAL | 0 refills | Status: AC | PRN

## 2019-05-28 MED ORDER — MORPHINE SULFATE (PF) 4 MG/ML IV SOLN
4.0000 mg | Freq: Once | INTRAVENOUS | Status: AC
  Administered 2019-05-28: 4 mg via INTRAVENOUS
  Filled 2019-05-28: qty 1

## 2019-05-28 MED ORDER — KETOROLAC TROMETHAMINE 30 MG/ML IJ SOLN
30.0000 mg | Freq: Once | INTRAMUSCULAR | Status: AC
  Administered 2019-05-28: 12:00:00 30 mg via INTRAVENOUS
  Filled 2019-05-28: qty 1

## 2019-05-28 MED ORDER — ONDANSETRON 4 MG PO TBDP: 4.0000 mg | ORAL_TABLET | Freq: Three times a day (TID) | ORAL | 0 refills | Status: DC | PRN

## 2019-05-28 MED ORDER — ONDANSETRON HCL 4 MG/2ML IJ SOLN
4.0000 mg | Freq: Once | INTRAMUSCULAR | Status: AC
  Administered 2019-05-28: 4 mg via INTRAVENOUS
  Filled 2019-05-28: qty 2

## 2019-05-28 NOTE — ED Provider Notes (Addendum)
[facility] DEPT Provider Note   CSN: [id number] Arrival date & time: 05/28/19  6160     History Chief Complaint  Patient presents with  . Flank Pain  . Abdominal Pain  . Cough    Bryan Shaffer is a 44 y.o. male.  HPI  Patient is 44 year old male with history of kidney stones presented today with left flank pain that radiates to his left groin and testicle.  He also endorses pain with urinating.  States that he has seen hematuria occasionally recently 3 days ago. Also endorses occasional nausea.    He also states for the past week he has had body aches, fatigue, cough, loss of taste and smell, fever which is now improved.  Patient states he does have some chest pain when he coughs but has no pain between coughing spells.  Denies any hemoptysis, denies any history of blood clots, shortness of breath, palpitations or calf tenderness or swelling.  Patient denies any exertional chest pain.  States that his symptoms are worse at night when he feels like he coughs more.  Patient has no known Covid exposure.  States that he works at home.  Patient endorses some mild diarrhea but denies any other changes with bowel movements, denies any testicular pain or lumps, denies any penile discharge, denies any concern for sexually transmitted diseases, denies any abdominal pain other than his left lower quadrant.    Past Medical History:  Diagnosis Date  . Kidney calculi     There are no problems to display for this patient.   History reviewed. No pertinent surgical history.     No family history on file.  Social History   Tobacco Use  . Smoking status: Never Smoker  . Smokeless tobacco: Never Used  Substance Use Topics  . Alcohol use: No  . Drug use: No    Home Medications Prior to Admission medications   Medication Sig Start Date End Date Taking? Authorizing Provider  acetaminophen (TYLENOL) 500 MG tablet Take 1,000 mg by mouth every 6  (six) hours as needed for moderate pain. For pain     [provider]  ibuprofen (ADVIL) 600 MG tablet Take 1 tablet (600 mg total) by mouth every 6 (six) hours as needed. 03/16/19   Fatima Blank, MD  ondansetron (ZOFRAN ODT) 4 MG disintegrating tablet Take 1 tablet (4 mg total) by mouth every 8 (eight) hours as needed for nausea or vomiting. 05/28/19   Kimarion Chery, Kathleene Hazel, PA  ondansetron (ZOFRAN) 4 MG tablet Take 1 tablet (4 mg total) by mouth every 8 (eight) hours as needed for up to 15 doses for nausea or vomiting. Patient not taking: Reported on 03/16/2019 03/15/19   Julianne Rice, MD  oxyCODONE-acetaminophen (PERCOCET/ROXICET) 5-325 MG tablet Take 1-2 tablets by mouth every 4 (four) hours as needed for severe pain. 05/28/19   Tedd Sias, PA    Allergies    Patient has no known allergies.  Review of Systems   Review of Systems  Constitutional: Positive for appetite change (Secondary to loss of taste and smell), chills, fatigue and fever.  HENT: Positive for congestion.   Eyes: Negative for pain.  Respiratory: Positive for cough. Negative for shortness of breath.   Cardiovascular: Negative for chest pain and leg swelling.  Gastrointestinal: Positive for abdominal pain, diarrhea and nausea. Negative for vomiting.  Genitourinary: Positive for dysuria and hematuria. Negative for discharge, penile pain and penile swelling.  Musculoskeletal: Positive for myalgias.  Skin:  Negative for rash.  Neurological: Negative for dizziness and headaches.    Physical Exam Updated Vital Signs BP (!) 135/103 (BP Location: Right Arm)   Pulse 93   Temp 99.1 F (37.3 C)   Resp 18   SpO2 100%   Physical Exam Vitals and nursing note reviewed.  Constitutional:      General: He is not in acute distress. HENT:     Head: Normocephalic and atraumatic.     Nose: Nose normal.     Mouth/Throat:     Mouth: Mucous membranes are dry.  Eyes:     General: No scleral  icterus. Cardiovascular:     Rate and Rhythm: Normal rate and regular rhythm.     Pulses: Normal pulses.     Heart sounds: Normal heart sounds.  Pulmonary:     Effort: Pulmonary effort is normal. No respiratory distress.     Breath sounds: No wheezing.  Abdominal:     General: There is no distension.     Palpations: Abdomen is soft.     Tenderness: There is abdominal tenderness (Left lower quadrant tenderness to palpation). There is left CVA tenderness. There is no right CVA tenderness, guarding or rebound.  Genitourinary:    Testes: Cremasteric reflex is present.     Comments: Mild left testicular tenderness to palpation. No masses or abnormal lie. Musculoskeletal:     Cervical back: Normal range of motion.     Right lower leg: No edema.     Left lower leg: No edema.     Comments: No calf tenderness, no lower extremity swelling  Skin:    General: Skin is warm and dry.     Capillary Refill: Capillary refill takes less than 2 seconds.  Neurological:     Mental Status: He is alert. Mental status is at baseline.  Psychiatric:        Mood and Affect: Mood normal.        Behavior: Behavior normal.     ED Results / Procedures / Treatments   Labs (all labs ordered are listed, but only abnormal results are displayed) Labs Reviewed  BASIC METABOLIC PANEL - Abnormal; Notable for the following components:      Result Value   Glucose, Bld 114 (*)    All other components within normal limits  CBC - Abnormal; Notable for the following components:   RBC 5.83 (*)    Hemoglobin 17.6 (*)    All other components within normal limits  URINALYSIS, ROUTINE W REFLEX MICROSCOPIC - Abnormal; Notable for the following components:   Color, Urine AMBER (*)    APPearance HAZY (*)    Hgb urine dipstick LARGE (*)    Protein, ur 30 (*)    RBC / HPF >50 (*)    Bacteria, UA FEW (*)    All other components within normal limits  SARS CORONAVIRUS 2 (TAT 6-24 HRS)  URINE CULTURE  LIPASE, BLOOD   GC/CHLAMYDIA PROBE AMP ([place]) NOT AT [facility]  TROPONIN I (HIGH SENSITIVITY)    EKG None  Radiology DG Chest 2 View  Result Date: 05/28/2019 CLINICAL DATA:  Chest pain today. EXAM: CHEST - 2 VIEW COMPARISON:  None. FINDINGS: Lungs clear. Heart size normal. No pneumothorax or pleural fluid. No acute or focal bony abnormality. IMPRESSION: Negative chest. Electronically Signed   By: Drusilla Kanner M.D.   On: 05/28/2019 09:40   CT Renal Stone Study  Result Date: 05/28/2019 CLINICAL DATA:  Left flank pain, hematuria EXAM: CT  ABDOMEN AND PELVIS WITHOUT CONTRAST TECHNIQUE: Multidetector CT imaging of the abdomen and pelvis was performed following the standard protocol without IV contrast. COMPARISON:  03/15/2019 FINDINGS: Lower chest: Bibasilar atelectasis/consolidation. Hepatobiliary: No abnormality in the visualized liver. Gallbladder is unremarkable. Pancreas: Unremarkable. Spleen: Unremarkable. Adrenals/Urinary Tract: There is a 3 mm calculus at the left ureteral orifice within the bladder. This may reflect the proximal calculus on the prior study that is no longer present. There is mild hydroureter and hydronephrosis. Punctate bilateral renal calculi are unchanged. Nondistended bladder is otherwise unremarkable. Adrenals are normal in appearance. Stomach/Bowel: Stomach is within normal limits. Bowel is normal in caliber. Normal appendix. Vascular/Lymphatic: No significant vascular findings on this noncontrast study. No adenopathy. Reproductive: Normal prostate. Other: No abdominal wall hernia or abnormality. No abdominopelvic ascites. Musculoskeletal: No acute osseous abnormality. IMPRESSION: 3 mm obstructing calculus at the left ureterovesical junction. This may reflect migration of previously seen proximal ureteral calculus. Mild hydroureteronephrosis. Bibasilar atelectasis/consolidation. Otherwise stable findings detailed above. Electronically Signed   By: Guadlupe Spanish M.D.   On: 05/28/2019  11:49    Procedures Procedures (including critical care time)  Medications Ordered in ED Medications  ketorolac (TORADOL) 30 MG/ML injection 30 mg (has no administration in time range)  sodium chloride flush (NS) 0.9 % injection 3 mL (3 mLs Intravenous Given 05/28/19 1036)  sodium chloride 0.9 % bolus 1,000 mL (1,000 mLs Intravenous New Bag/Given 05/28/19 1036)  morphine 4 MG/ML injection 4 mg (4 mg Intravenous Given 05/28/19 1035)  ondansetron (ZOFRAN) injection 4 mg (4 mg Intravenous Given 05/28/19 1034)    ED Course  I have reviewed the triage vital signs and the nursing notes.  Pertinent labs & imaging results that were available during my care of the patient were reviewed by me and considered in my medical decision making (see chart for details).  Clinical Course as of May 27 1214  Tue May 28, 2019  1135 Phone and ordered at triage.  Is within normal limits.  Patient is not having chest pain concerning for ACS.  Discontinued second troponin.  Troponin I (High Sensitivity) [WF]  1136 unremarkable.  Basic metabolic panel(!) [WF]  1136 Patient appears somewhat hemoconcentrated likely secondary to dehydration.  CBC(!) [WF]  1136 Urine with significant RBCs large hemoglobin some protein likely secondary to kidney stone.  Few bacteria present.  Urinalysis, Routine w reflex microscopic- may I&O cath if menses(!) [WF]    Clinical Course User Index [WF] Gailen Shelter, [place]   MDM Rules/Calculators/A&P                      CT scan shows left UVJ ureteral stone. This is consistent with patient's described pain and physical exam. Discussed with patient that there is a high likelihood that this will pass spontaneously. Will prescribe him Zofran and OxyContin for pain. Will recommend that he strain his urine and follow-up with urology.  Urine does not look grossly infected. Will culture and call patient if results indicate need for antibiotics. He is afebrile and without leukocytosis to  indicate infection.  Left testicular pain likely referred pain from kidney stone. Doubt testicular etiology of patient's symptoms today. Urine culture pending. GC chlamydia urine results pending. No negation for empiric treatment today.  I discussed this case with my attending physician who cosigned this note including patient's presenting symptoms, physical exam, and planned diagnostics and interventions. Attending physician stated agreement with plan or made changes to plan which were implemented.   Patient  likely has COVID as well. Has symptoms of such. Recommended quarantine / precautions.   Bryan Shaffer was evaluated in Emergency Department on 05/28/2019 for the symptoms described in the history of present illness. He was evaluated in the context of the global COVID-19 pandemic, which necessitated consideration that the patient might be at risk for infection with the SARS-CoV-2 virus that causes COVID-19. Institutional protocols and algorithms that pertain to the evaluation of patients at risk for COVID-19 are in a state of rapid change based on information released by regulatory bodies including the CDC and federal and state organizations. These policies and algorithms were followed during the patient's care in the ED.  Final Clinical Impression(s) / ED Diagnoses Final diagnoses:  Kidney stone  Suspected COVID-19 virus infection    Rx / DC Orders ED Discharge Orders         Ordered    oxyCODONE-acetaminophen (PERCOCET/ROXICET) 5-325 MG tablet  Every 4 hours PRN     05/28/19 1211    ondansetron (ZOFRAN ODT) 4 MG disintegrating tablet  Every 8 hours PRN     05/28/19 1211           Gailen Shelter, [place] 05/28/19 1214    Solon Augusta [place], [place] 05/28/19 1216    Margarita Grizzle, MD 05/28/19 1514

## 2019-05-28 NOTE — Discharge Instructions (Addendum)
76% of kidney stones pass spontaneously. I have prescribed you zofran for nausea and oxycodone for pain. You should strain your urine and take any stone you find to urology for analysis.

## 2019-05-28 NOTE — ED Triage Notes (Signed)
Pt states he has been having chest pain since last Wednesday, worse with deep breath. 3-4 days ago fever, body aches, diarrhea, eye pain , later has resolved, Last 3 days hematuria

## 2019-05-29 LAB — URINE CULTURE: Culture: >=10000 — AB

## 2019-06-01 ENCOUNTER — Emergency Department (HOSPITAL_COMMUNITY): Payer: Self-pay

## 2019-06-01 ENCOUNTER — Emergency Department (HOSPITAL_COMMUNITY)
Admission: EM | Admit: 2019-06-01 | Discharge: 2019-06-01 | Disposition: A | Discharge status: Home / Self Care | Payer: Self-pay | Attending: Emergency Medicine | Admitting: Emergency Medicine

## 2019-06-01 ENCOUNTER — Other Ambulatory Visit: Payer: Self-pay

## 2019-06-01 ENCOUNTER — Encounter (HOSPITAL_COMMUNITY): Payer: Self-pay

## 2019-06-01 DIAGNOSIS — U071 COVID-19: Secondary | ICD-10-CM | POA: Insufficient documentation

## 2019-06-01 MED ORDER — ONDANSETRON 4 MG PO TBDP: ORAL_TABLET | ORAL | 0 refills | Status: AC

## 2019-06-01 MED ORDER — BENZONATATE 100 MG PO CAPS: 100.0000 mg | ORAL_CAPSULE | Freq: Three times a day (TID) | ORAL | 0 refills | Status: DC

## 2019-06-01 NOTE — Discharge Instructions (Signed)
Take tylenol 2 pills 4 times a day and motrin 4 pills 3 times a day.  Drink plenty of fluids.  Return for worsening shortness of breath, headache, confusion. Follow up with your family doctor.   

## 2019-06-01 NOTE — ED Notes (Signed)
Patient maintained O2 above 94% on room air while ambulating.

## 2019-06-01 NOTE — ED Triage Notes (Addendum)
Pt reports that he tested positive for COVID on 1/26. He reports increasing SOB over the last few days. Dry cough noted.

## 2019-06-01 NOTE — ED Provider Notes (Signed)
[facility]-EMERGENCY DEPT Provider Note   CSN: [id number] Arrival date & time: 06/01/19  1959     History No chief complaint on file.   Bryan Shaffer is a 44 y.o. male.  44 yo M with a chief complaints of cough and shortness of breath.  Shortness of breath is worse when coughing.  Going on for about 11 days now.  Having some diarrhea.  No nausea no abdominal pain no chest pain.  After some fits of forceful coughing he noticed that he has had some streaking of blood in his sputum.  Ongoing fevers and chills.  The history is provided by the patient.  Illness Severity:  Moderate Onset quality:  Gradual Duration:  11 days Timing:  Intermittent Progression:  Waxing and waning Chronicity:  New Associated symptoms: cough, diarrhea, fever, myalgias and shortness of breath   Associated symptoms: no abdominal pain, no chest pain, no congestion, no headaches, no rash and no vomiting        Past Medical History:  Diagnosis Date  . Kidney calculi     There are no problems to display for this patient.   History reviewed. No pertinent surgical history.     History reviewed. No pertinent family history.  Social History   Tobacco Use  . Smoking status: Never Smoker  . Smokeless tobacco: Never Used  Substance Use Topics  . Alcohol use: No  . Drug use: No    Home Medications Prior to Admission medications   Medication Sig Start Date End Date Taking? Authorizing Provider  acetaminophen (TYLENOL) 500 MG tablet Take 1,000 mg by mouth every 6 (six) hours as needed for moderate pain. For pain     [provider]  benzonatate (TESSALON) 100 MG capsule Take 1 capsule (100 mg total) by mouth every 8 (eight) hours. 06/01/19   Melene Plan, DO  ibuprofen (ADVIL) 600 MG tablet Take 1 tablet (600 mg total) by mouth every 6 (six) hours as needed. 03/16/19   Nira Conn, MD  ondansetron (ZOFRAN ODT) 4 MG disintegrating tablet 4mg  ODT q4 hours  prn nausea/vomit 06/01/19   06/03/19, DO  ondansetron (ZOFRAN) 4 MG tablet Take 1 tablet (4 mg total) by mouth every 8 (eight) hours as needed for up to 15 doses for nausea or vomiting. Patient not taking: Reported on 03/16/2019 03/15/19   03/17/19, MD  oxyCODONE-acetaminophen (PERCOCET/ROXICET) 5-325 MG tablet Take 1-2 tablets by mouth every 4 (four) hours as needed for severe pain. 05/28/19   05/30/19, PA    Allergies    Patient has no known allergies.  Review of Systems   Review of Systems  Constitutional: Positive for chills and fever.  HENT: Negative for congestion and facial swelling.   Eyes: Negative for discharge and visual disturbance.  Respiratory: Positive for cough and shortness of breath.   Cardiovascular: Negative for chest pain and palpitations.  Gastrointestinal: Positive for diarrhea. Negative for abdominal pain and vomiting.  Musculoskeletal: Positive for myalgias. Negative for arthralgias.  Skin: Negative for color change and rash.  Neurological: Negative for tremors, syncope and headaches.  Psychiatric/Behavioral: Negative for confusion and dysphoric mood.    Physical Exam Updated Vital Signs BP 117/84   Pulse 96   Temp 99.6 F (37.6 C) (Oral)   Resp (!) 30   SpO2 93%   Physical Exam Vitals and nursing note reviewed.  Constitutional:      Appearance: He is well-developed.  HENT:  Head: Normocephalic and atraumatic.  Eyes:     Pupils: Pupils are equal, round, and reactive to light.  Neck:     Vascular: No JVD.  Cardiovascular:     Rate and Rhythm: Normal rate and regular rhythm.     Heart sounds: No murmur. No friction rub. No gallop.   Pulmonary:     Effort: No respiratory distress.     Breath sounds: No wheezing.  Abdominal:     General: There is no distension.     Tenderness: There is no abdominal tenderness. There is no guarding or rebound.  Musculoskeletal:        General: Normal range of motion.     Cervical back:  Normal range of motion and neck supple.  Skin:    Coloration: Skin is not pale.     Findings: No rash.  Neurological:     Mental Status: He is alert and oriented to person, place, and time.  Psychiatric:        Behavior: Behavior normal.     ED Results / Procedures / Treatments   Labs (all labs ordered are listed, but only abnormal results are displayed) Labs Reviewed - No data to display  EKG EKG Interpretation  Date/Time:  Saturday June 01 2019 20:14:50 EST Ventricular Rate:  82 PR Interval:    QRS Duration: 82 QT Interval:  356 QTC Calculation: 416 R Axis:   58 Text Interpretation: Sinus rhythm Ventricular premature complex No significant change since last tracing Confirmed by Deno Etienne [phone]) on 06/01/2019 8:45:14 PM   Radiology DG Chest Port 1 View  Result Date: 06/01/2019 CLINICAL DATA:  Cough and shortness of breath. Patient tested positive for COVID-19 on May 28, 2019. EXAM: PORTABLE CHEST 1 VIEW COMPARISON:  May 28, 2019 FINDINGS: There is new infiltrate in the periphery of the left lower lung. The heart, hila, mediastinum, lungs, and pleura are otherwise unremarkable. No pneumothorax. IMPRESSION: New infiltrate in the periphery of the left lower lung consistent with the patient's COVID-19 diagnosis. Electronically Signed   By: Dorise Bullion III M.D   On: 06/01/2019 20:56    Procedures Procedures (including critical care time)  Medications Ordered in ED Medications - No data to display  ED Course  I have reviewed the triage vital signs and the nursing notes.  Pertinent labs & imaging results that were available during my care of the patient were reviewed by me and considered in my medical decision making (see chart for details).    MDM Rules/Calculators/A&P                      44 yo M with a chief complaints of shortness of breath and cough.  Going on for about 11 days.  Has been diagnosed with the novel coronavirus.  He is describing some  streaks of blood in his cough.  He is otherwise well-appearing nontoxic.  He was 99 % on room air on my exam.  Clear lungs.  Will attempt to ambulate in the room.   Ambulated without hypoxia.  D/c home.   11:38 PM:  I have discussed the diagnosis/risks/treatment options with the patient and believe the pt to be eligible for discharge home to follow-up with PCP. We also discussed returning to the ED immediately if new or worsening sx occur. We discussed the sx which are most concerning (e.g., sudden worsening pain, fever, inability to tolerate by mouth) that necessitate immediate return. Medications administered to the patient during their  visit and any new prescriptions provided to the patient are listed below.  Medications given during this visit Medications - No data to display   The patient appears reasonably screen and/or stabilized for discharge and I doubt any other medical condition or other [facility] requiring further screening, evaluation, or treatment in the ED at this time prior to discharge.    Final Clinical Impression(s) / ED Diagnoses Final diagnoses:  COVID-19 virus infection    Rx / DC Orders ED Discharge Orders         Ordered    ondansetron (ZOFRAN ODT) 4 MG disintegrating tablet     06/01/19 2131    benzonatate (TESSALON) 100 MG capsule  Every 8 hours     06/01/19 2131           Melene Plan, DO 06/01/19 2338

## 2020-04-20 ENCOUNTER — Emergency Department (HOSPITAL_COMMUNITY)
Admission: EM | Admit: 2020-04-20 | Discharge: 2020-04-20 | Disposition: A | Discharge status: Home / Self Care | Payer: Self-pay | Attending: Emergency Medicine | Admitting: Emergency Medicine

## 2020-04-20 ENCOUNTER — Encounter (HOSPITAL_COMMUNITY): Payer: Self-pay

## 2020-04-20 ENCOUNTER — Emergency Department (HOSPITAL_COMMUNITY): Payer: Self-pay

## 2020-04-20 DIAGNOSIS — Z20822 Contact with and (suspected) exposure to covid-19: Secondary | ICD-10-CM | POA: Insufficient documentation

## 2020-04-20 DIAGNOSIS — J101 Influenza due to other identified influenza virus with other respiratory manifestations: Secondary | ICD-10-CM | POA: Insufficient documentation

## 2020-04-20 LAB — RESP PANEL BY RT-PCR (FLU A&B, COVID) ARPGX2
Influenza A by PCR: POSITIVE — AB
Influenza B by PCR: NEGATIVE
SARS Coronavirus 2 by RT PCR: NEGATIVE

## 2020-04-20 MED ORDER — BENZONATATE 100 MG PO CAPS
200.0000 mg | ORAL_CAPSULE | Freq: Once | ORAL | Status: AC
  Administered 2020-04-20: 200 mg via ORAL
  Filled 2020-04-20: qty 2

## 2020-04-20 MED ORDER — BENZONATATE 100 MG PO CAPS: 100.0000 mg | ORAL_CAPSULE | Freq: Three times a day (TID) | ORAL | 0 refills | Status: AC

## 2020-04-20 MED ORDER — NAPROXEN 500 MG PO TABS: 500.0000 mg | ORAL_TABLET | Freq: Two times a day (BID) | ORAL | 0 refills | Status: AC

## 2020-04-20 NOTE — Discharge Instructions (Signed)
Take the medication for body aches chills and coughing.  Make sure to rest and drink plenty of fluids.  It will likely take a week or so before you start feeling better.  Return to the ED as needed for worsening symptoms.  Follow-up with your primary care doctor today to be rechecked if your symptoms have not resolved by next week

## 2020-04-20 NOTE — ED Provider Notes (Signed)
[facility]-EMERGENCY DEPT Provider Note   CSN: [id number] Arrival date & time: 04/20/20  5188     History Chief Complaint  Patient presents with  . Fever  . Fatigue    Bryan Shaffer is a 44 y.o. male.  HPI   Patient presents the emergency room for evaluation of fevers chills cough and fatigue. Patient states he started having symptoms a couple days ago. He has been coughing. He is also having sharp pain in the middle and right side of the chest with coughing and deep breathing. He has had fevers but denies any issues with sore throat. No known sick contacts. Patient has not been vaccinated for Covid. Nursing notes indicate abdominal pain but the patient denies abdominal pain for me and points towards his chest   Past Medical History:  Diagnosis Date  . Kidney calculi     There are no problems to display for this patient.   History reviewed. No pertinent surgical history.     History reviewed. No pertinent family history.  Social History   Tobacco Use  . Smoking status: Never Smoker  . Smokeless tobacco: Never Used  Substance Use Topics  . Alcohol use: No  . Drug use: No    Home Medications Prior to Admission medications   Medication Sig Start Date End Date Taking? Authorizing Provider  acetaminophen (TYLENOL) 500 MG tablet Take 1,000 mg by mouth every 6 (six) hours as needed for moderate pain. For pain     [provider]  benzonatate (TESSALON) 100 MG capsule Take 1 capsule (100 mg total) by mouth every 8 (eight) hours. 04/20/20   Linwood Dibbles, MD  ibuprofen (ADVIL) 600 MG tablet Take 1 tablet (600 mg total) by mouth every 6 (six) hours as needed. 03/16/19   Nira Conn, MD  naproxen (NAPROSYN) 500 MG tablet Take 1 tablet (500 mg total) by mouth 2 (two) times daily with a meal. As needed for pain 04/20/20   Linwood Dibbles, MD  ondansetron (ZOFRAN ODT) 4 MG disintegrating tablet 4mg  ODT q4 hours prn nausea/vomit 06/01/19    06/03/19, DO  ondansetron (ZOFRAN) 4 MG tablet Take 1 tablet (4 mg total) by mouth every 8 (eight) hours as needed for up to 15 doses for nausea or vomiting. Patient not taking: Reported on 03/16/2019 03/15/19   03/17/19, MD  oxyCODONE-acetaminophen (PERCOCET/ROXICET) 5-325 MG tablet Take 1-2 tablets by mouth every 4 (four) hours as needed for severe pain. 05/28/19   05/30/19, PA    Allergies    Patient has no known allergies.  Review of Systems   Review of Systems  All other systems reviewed and are negative.   Physical Exam Updated Vital Signs BP 117/87   Pulse 87   Temp 100.2 F (37.9 C) (Oral)   Resp 20   SpO2 98%   Physical Exam Vitals and nursing note reviewed.  Constitutional:      General: He is not in acute distress.    Appearance: He is well-developed and well-nourished.  HENT:     Head: Normocephalic and atraumatic.     Right Ear: External ear normal.     Left Ear: External ear normal.  Eyes:     General: No scleral icterus.       Right eye: No discharge.        Left eye: No discharge.     Conjunctiva/sclera: Conjunctivae normal.  Neck:     Trachea: No tracheal deviation.  Cardiovascular:     Rate and Rhythm: Normal rate and regular rhythm.     Pulses: Intact distal pulses.  Pulmonary:     Effort: Pulmonary effort is normal. No respiratory distress.     Breath sounds: Normal breath sounds. No stridor. No wheezing or rales.     Comments: Frequent coughing Abdominal:     General: Bowel sounds are normal. There is no distension.     Palpations: Abdomen is soft.     Tenderness: There is no abdominal tenderness. There is no guarding or rebound.  Musculoskeletal:        General: No tenderness or edema.     Cervical back: Neck supple.  Skin:    General: Skin is warm and dry.     Findings: No rash.  Neurological:     Mental Status: He is alert.     Cranial Nerves: No cranial nerve deficit (no facial droop, extraocular movements intact,  no slurred speech).     Sensory: No sensory deficit.     Motor: No abnormal muscle tone or seizure activity.     Coordination: Coordination normal.     Deep Tendon Reflexes: Strength normal.  Psychiatric:        Mood and Affect: Mood and affect normal.     ED Results / Procedures / Treatments   Labs (all labs ordered are listed, but only abnormal results are displayed) Labs Reviewed  RESP PANEL BY RT-PCR (FLU A&B, COVID) ARPGX2 - Abnormal; Notable for the following components:      Result Value   Influenza A by PCR POSITIVE (*)    All other components within normal limits    EKG None  Radiology DG Chest Portable 1 View  Result Date: 04/20/2020 CLINICAL DATA:  Cough, fever. EXAM: PORTABLE CHEST 1 VIEW COMPARISON:  06/01/2019 and prior. FINDINGS: No focal consolidation. No pneumothorax or pleural effusion. Cardiomediastinal silhouette is within normal limits. No acute osseous abnormality. IMPRESSION: No focal airspace disease. Electronically Signed   By: Stana Bunting M.D.   On: 04/20/2020 10:55    Procedures Procedures (including critical care time)  Medications Ordered in ED Medications  benzonatate (TESSALON) capsule 200 mg (200 mg Oral Given 04/20/20 1044)    ED Course  I have reviewed the triage vital signs and the nursing notes.  Pertinent labs & imaging results that were available during my care of the patient were reviewed by me and considered in my medical decision making (see chart for details).  Clinical Course as of 04/20/20 1210  Mon Apr 20, 2020  1152 Chest x-ray negative.  Flu is positive [JK]    Clinical Course User Index [JK] Linwood Dibbles, MD   MDM Rules/Calculators/A&P                          Patient presented to ED with complaints of cough, fever, body aches.  Patient complain diffuse abdominal pain tenderness but does not have any focal tenderness on exam.  Chest x-ray does not show pneumonia.  Was concerned about the possibility of covid  and influenza and the patient has tested negative for Covid but does not have influenza.  He does not have an oxygen requirement.  He is nontoxic and well-appearing.  Will discharge home with course of Tessalon for cough and NSAIDs for body aches and pain.  Warning signs and precautions discussed. Final Clinical Impression(s) / ED Diagnoses Final diagnoses:  Influenza A    Rx /  DC Orders ED Discharge Orders         Ordered    benzonatate (TESSALON) 100 MG capsule  Every 8 hours        04/20/20 1210    naproxen (NAPROSYN) 500 MG tablet  2 times daily with meals        04/20/20 1210           Linwood Dibbles, MD 04/20/20 1211

## 2020-04-20 NOTE — ED Triage Notes (Signed)
Pt arrived via walk in, c/o fevers, chills, fatigue, nausea, and diffuse abd pain x4 days. Denies any known sick contacts.

## 2020-10-03 IMAGING — DX DG CHEST 1V PORT
1 series · 1 of 1 positions shown · non-contrast
Comparison: May 28, 2019

CLINICAL DATA: Cough and shortness of breath. Patient tested
positive for CVNPB-W5 on May 28, 2019.

EXAM:
PORTABLE CHEST 1 VIEW

[chest ap]
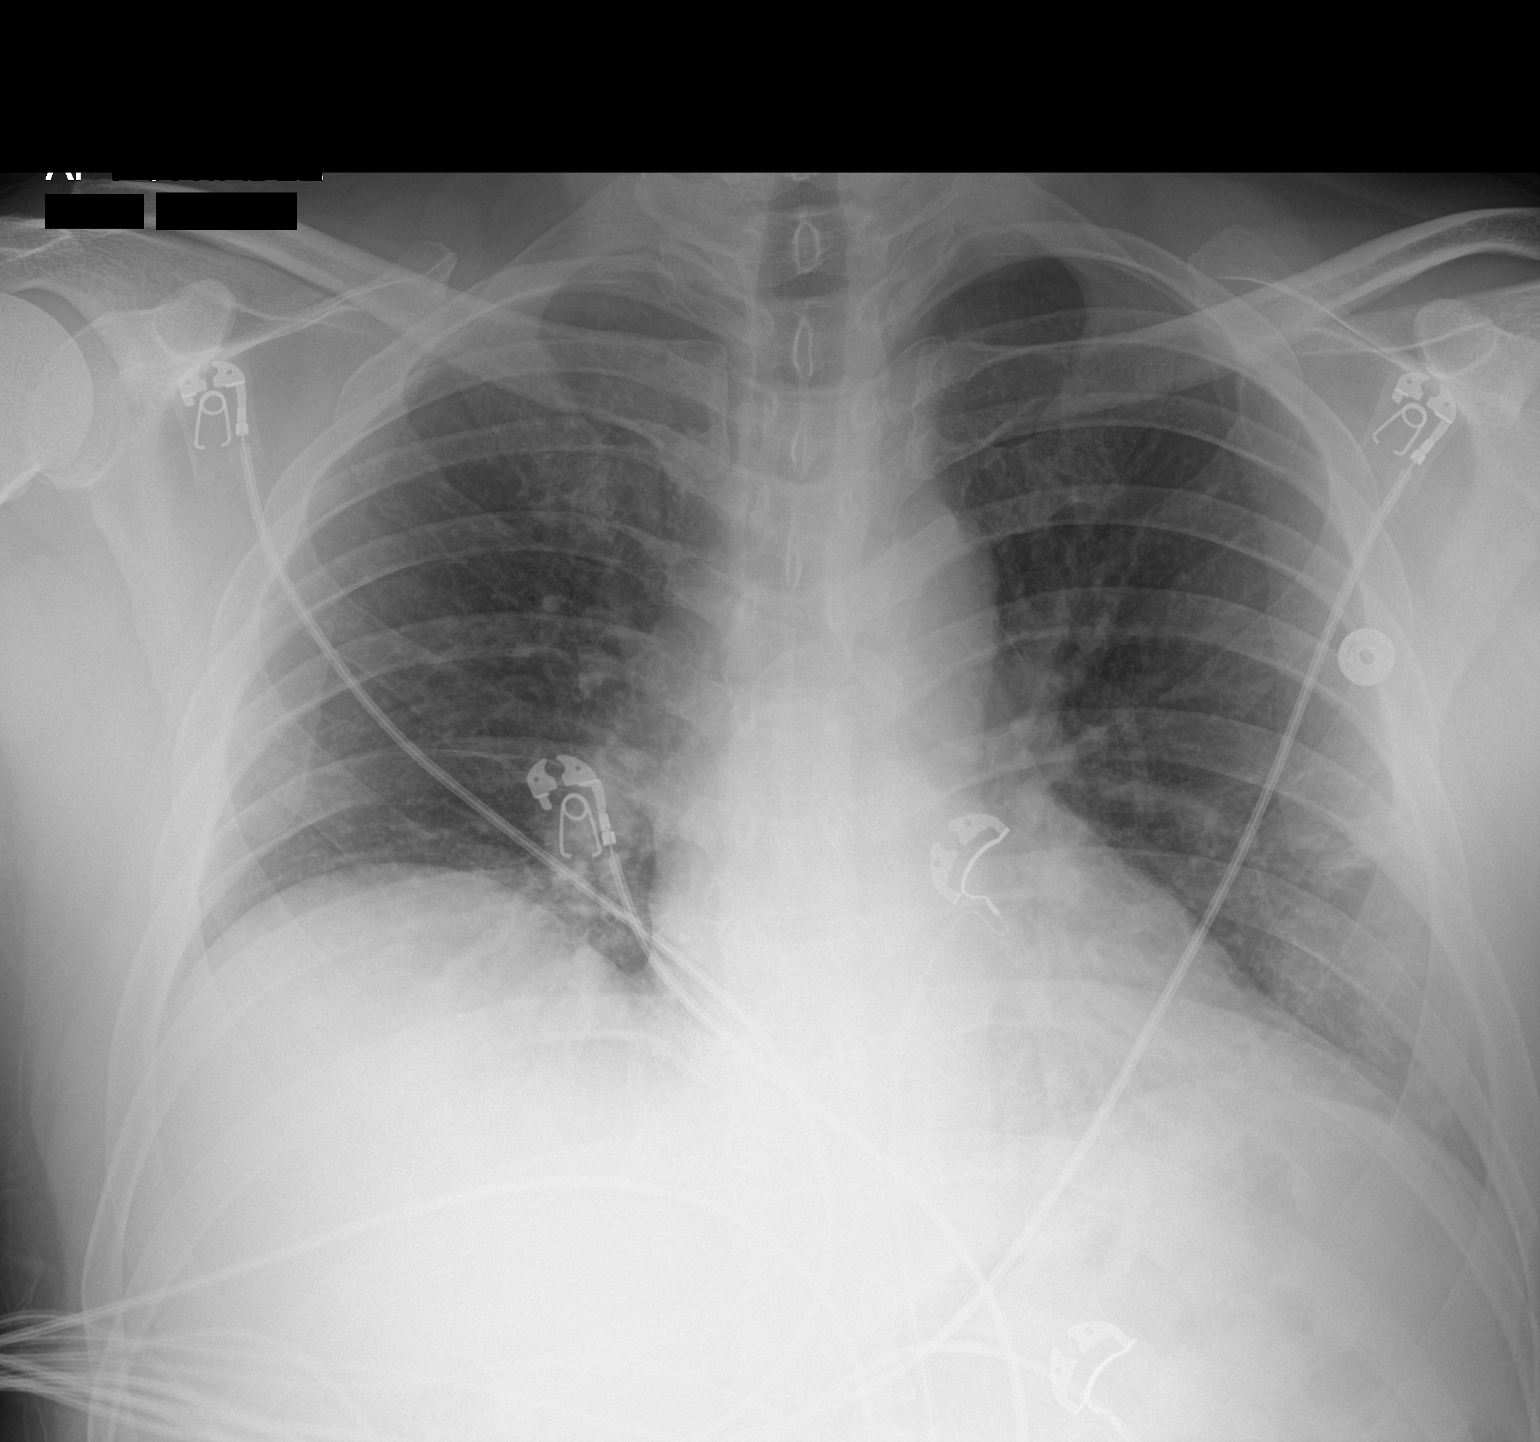

[1 of 1 positions shown; findings below may reference images not displayed]

FINDINGS: There is new infiltrate in the periphery of the left lower lung. The
heart, hila, mediastinum, lungs, and pleura are otherwise
unremarkable. No pneumothorax.
IMPRESSION: New infiltrate in the periphery of the left lower lung consistent
with the patient's CVNPB-W5 diagnosis.

## 2021-08-23 IMAGING — DX DG CHEST 1V PORT
1 series · 1 of 1 positions shown · non-contrast
Comparison: 06/01/2019 and prior.

CLINICAL DATA: Cough, fever.

EXAM:
PORTABLE CHEST 1 VIEW

[chest ap]
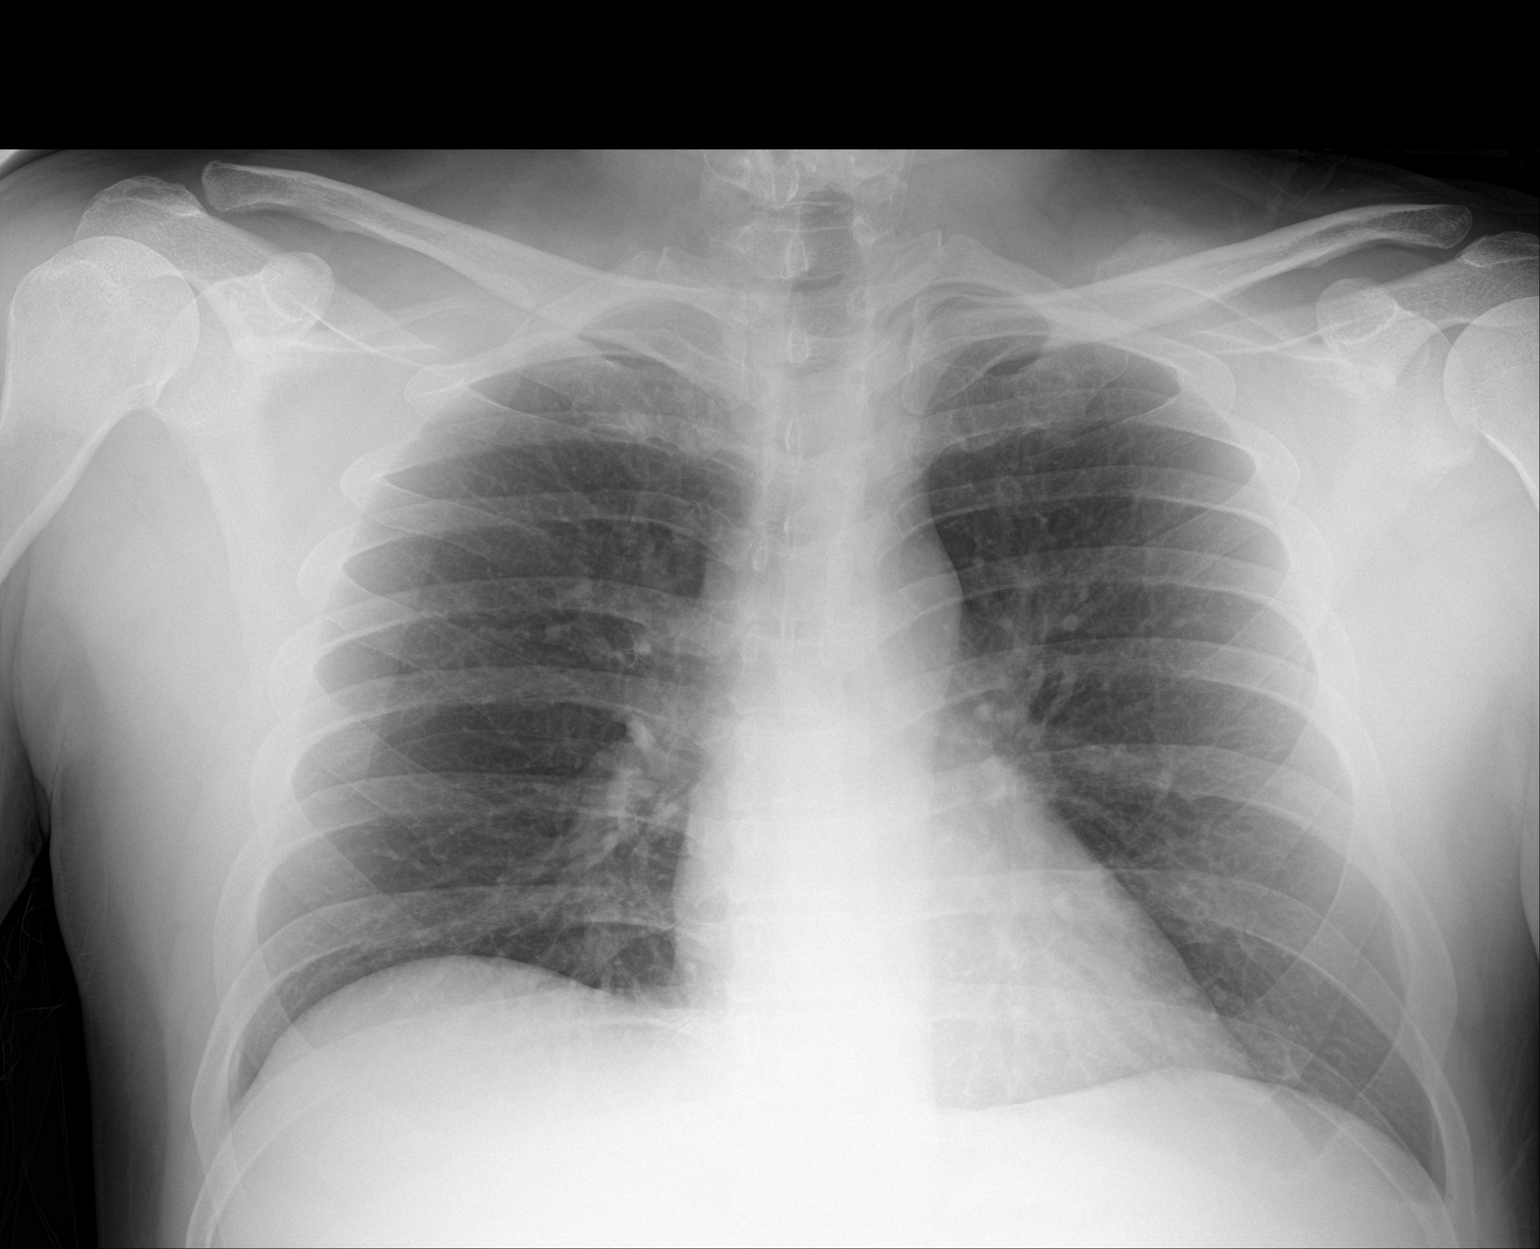

[1 of 1 positions shown; findings below may reference images not displayed]

FINDINGS: No focal consolidation. No pneumothorax or pleural effusion.
Cardiomediastinal silhouette is within normal limits. No acute
osseous abnormality.
IMPRESSION: No focal airspace disease.

## 2022-11-15 ENCOUNTER — Emergency Department (HOSPITAL_COMMUNITY)
Admission: EM | Admit: 2022-11-15 | Discharge: 2022-11-15 | Disposition: A | Discharge status: Home / Self Care | Payer: Self-pay | Attending: Emergency Medicine | Admitting: Emergency Medicine

## 2022-11-15 ENCOUNTER — Other Ambulatory Visit: Payer: Self-pay

## 2022-11-15 DIAGNOSIS — A64 Unspecified sexually transmitted disease: Secondary | ICD-10-CM | POA: Insufficient documentation

## 2022-11-15 DIAGNOSIS — B3742 Candidal balanitis: Secondary | ICD-10-CM | POA: Insufficient documentation

## 2022-11-15 MED ORDER — FLUCONAZOLE 150 MG PO TABS: 150.0000 mg | ORAL_TABLET | Freq: Every day | ORAL | 0 refills | Status: AC

## 2022-11-15 MED ORDER — CLOTRIMAZOLE 1 % EX CREA: TOPICAL_CREAM | CUTANEOUS | 0 refills | Status: AC

## 2022-11-15 NOTE — Discharge Instructions (Addendum)
Return for any problem.  Apply prescribed lotion to affected area of the penis.  Take oral Diflucan as prescribed.

## 2022-11-15 NOTE — ED Provider Notes (Signed)
[facility] EMERGENCY DEPARTMENT AT [facility] Provider Note   CSN: [id number] Arrival date & time: 11/15/22  1803     History  Chief Complaint  Patient presents with   SEXUALLY TRANSMITTED DISEASE    Bryan Shaffer is a 47 y.o. male.  47 year old male with prior medical history as detailed below presents for evaluation.  Patient reports itchy white lesions on the head of his penis x 4 days.  He denies injury.  He denies fever.  He denies other symptoms such as dysuria, increased urination, rash.  The history is provided by the patient and medical records.       Home Medications Prior to Admission medications   Medication Sig Start Date End Date Taking? Authorizing Provider  clotrimazole (LOTRIMIN) 1 % cream Apply to affected area 2 times daily 11/15/22  Yes Casmira Cramer, Noralyn Pick, MD  fluconazole (DIFLUCAN) 150 MG tablet Take 1 tablet (150 mg total) by mouth daily for 3 days. 11/15/22 11/18/22 Yes MessickNoralyn Pick, MD  acetaminophen (TYLENOL) 500 MG tablet Take 1,000 mg by mouth every 6 (six) hours as needed for moderate pain. For pain     [provider]  benzonatate (TESSALON) 100 MG capsule Take 1 capsule (100 mg total) by mouth every 8 (eight) hours. 04/20/20   Linwood Dibbles, MD  ibuprofen (ADVIL) 600 MG tablet Take 1 tablet (600 mg total) by mouth every 6 (six) hours as needed. 03/16/19   Nira Conn, MD  naproxen (NAPROSYN) 500 MG tablet Take 1 tablet (500 mg total) by mouth 2 (two) times daily with a meal. As needed for pain 04/20/20   Linwood Dibbles, MD  ondansetron (ZOFRAN ODT) 4 MG disintegrating tablet 4mg  ODT q4 hours prn nausea/vomit 06/01/19   Melene Plan, DO  ondansetron (ZOFRAN) 4 MG tablet Take 1 tablet (4 mg total) by mouth every 8 (eight) hours as needed for up to 15 doses for nausea or vomiting. Patient not taking: Reported on 03/16/2019 03/15/19   Chester Holstein, MD  oxyCODONE-acetaminophen (PERCOCET/ROXICET) 5-325 MG tablet Take 1-2  tablets by mouth every 4 (four) hours as needed for severe pain. 05/28/19   Gailen Shelter, PA      Allergies    Patient has no known allergies.    Review of Systems   Review of Systems  All other systems reviewed and are negative.   Physical Exam Updated Vital Signs BP (!) 138/95 (BP Location: Left Arm)   Pulse 88   Temp 98.5 F (36.9 C) (Oral)   Resp 17   Ht 5\' 7"  (1.702 m)   Wt 90.7 kg   SpO2 99%   BMI 31.32 kg/m  Physical Exam Vitals and nursing note reviewed.  Constitutional:      General: He is not in acute distress.    Appearance: Normal appearance. He is well-developed.  HENT:     Head: Normocephalic and atraumatic.  Eyes:     Conjunctiva/sclera: Conjunctivae normal.     Pupils: Pupils are equal, round, and reactive to light.  Cardiovascular:     Rate and Rhythm: Normal rate and regular rhythm.     Heart sounds: Normal heart sounds.  Pulmonary:     Effort: Pulmonary effort is normal. No respiratory distress.     Breath sounds: Normal breath sounds.  Abdominal:     General: There is no distension.     Palpations: Abdomen is soft.     Tenderness: There is no abdominal tenderness.  Genitourinary:  Comments: Scattered punctate white plaques on the glans of the penis, the foreskin with mildly erythematous base.  Exam is consistent with candidal balanitis.  Patient is uncircumcised.  No expressible purulence from the meatus.  No appreciable lymphadenopathy in the groin. Musculoskeletal:        General: No deformity. Normal range of motion.     Cervical back: Normal range of motion and neck supple.  Skin:    General: Skin is warm and dry.  Neurological:     General: No focal deficit present.     Mental Status: He is alert and oriented to person, place, and time.     ED Results / Procedures / Treatments   Labs (all labs ordered are listed, but only abnormal results are displayed) Labs Reviewed - No data to display  EKG None  Radiology No  results found.  Procedures Procedures    Medications Ordered in ED Medications - No data to display  ED Course/ Medical Decision Making/ A&P                             Medical Decision Making Risk Prescription drug management.    Medical Screen Complete  This patient presented to the ED with complaint of lesions on head of penis.  This complaint involves an extensive number of treatment options. The initial differential diagnosis includes, but is not limited to, candidal balanitis  This presentation is: Acute, Self-Limited, and Previously Undiagnosed  Patient presents with complaint of lesions to the head of the penis.  Exam is consistent with candidal balanitis.  Patient given prescriptions for treatment of same.  Patient is advised to follow-up closely with his regular healthcare provider and also to consider following up with the [facility] Department of public health.  Additional STD screening is available through Department of Public Health.  Importance of close follow-up stressed.  Strict return precautions given and understood.  Additional history obtained: External records from outside sources obtained and reviewed including prior ED visits and prior Inpatient records.   Problem List / ED Course:  Candidal balanitis   Reevaluation:  After the interventions noted above, I reevaluated the patient and found that they have: improved   Disposition:  After consideration of the diagnostic results and the patients response to treatment, I feel that the patent would benefit from close outpatient follow-up.          Final Clinical Impression(s) / ED Diagnoses Final diagnoses:  Candidal balanitis    Rx / DC Orders ED Discharge Orders          Ordered    fluconazole (DIFLUCAN) 150 MG tablet  Daily        11/15/22 1848    clotrimazole (LOTRIMIN) 1 % cream        11/15/22 1848              Wynetta Fines, MD 11/15/22 1851

## 2022-11-15 NOTE — ED Triage Notes (Signed)
Pt reports itching in groin and "white patch on head of penis" x 4 days.
